# Patient Record
Sex: Female | Born: 1960 | Race: White | Hispanic: No | Smoking: Former smoker
Health system: Southern US, Community
[De-identification: ages and names within clinical notes are randomized; demographics above are authoritative.]

## PROBLEM LIST (undated history)

## (undated) DIAGNOSIS — G8929 Other chronic pain: Secondary | ICD-10-CM

## (undated) DIAGNOSIS — M419 Scoliosis, unspecified: Secondary | ICD-10-CM

## (undated) DIAGNOSIS — K5909 Other constipation: Secondary | ICD-10-CM

## (undated) DIAGNOSIS — R413 Other amnesia: Secondary | ICD-10-CM

## (undated) DIAGNOSIS — M961 Postlaminectomy syndrome, not elsewhere classified: Secondary | ICD-10-CM

## (undated) DIAGNOSIS — M810 Age-related osteoporosis without current pathological fracture: Secondary | ICD-10-CM

## (undated) DIAGNOSIS — R5382 Chronic fatigue, unspecified: Secondary | ICD-10-CM

## (undated) DIAGNOSIS — I509 Heart failure, unspecified: Secondary | ICD-10-CM

## (undated) DIAGNOSIS — M549 Dorsalgia, unspecified: Secondary | ICD-10-CM

## (undated) DIAGNOSIS — I10 Essential (primary) hypertension: Secondary | ICD-10-CM

## (undated) HISTORY — DX: Essential (primary) hypertension: I10

## (undated) HISTORY — DX: Other amnesia: R41.3

## (undated) HISTORY — PX: TUBAL LIGATION: SHX77

## (undated) HISTORY — PX: BACK SURGERY: SHX140

## (undated) HISTORY — DX: Age-related osteoporosis without current pathological fracture: M81.0

## (undated) HISTORY — DX: Chronic fatigue, unspecified: R53.82

## (undated) HISTORY — DX: Heart failure, unspecified: I50.9

---
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
## 2009-11-21 ENCOUNTER — Ambulatory Visit: Payer: Self-pay | Admitting: Diagnostic Radiology

## 2009-11-21 ENCOUNTER — Emergency Department (HOSPITAL_BASED_OUTPATIENT_CLINIC_OR_DEPARTMENT_OTHER)
Admission: EM | Admit: 2009-11-21 | Discharge: 2009-11-21 | Payer: Self-pay | Source: Home / Self Care | Admitting: Emergency Medicine

## 2009-12-04 ENCOUNTER — Emergency Department (HOSPITAL_BASED_OUTPATIENT_CLINIC_OR_DEPARTMENT_OTHER): Admission: EM | Admit: 2009-12-04 | Discharge: 2009-12-04 | Payer: Self-pay | Admitting: Emergency Medicine

## 2014-08-06 ENCOUNTER — Ambulatory Visit: Payer: Self-pay

## 2014-09-28 ENCOUNTER — Ambulatory Visit: Payer: Self-pay

## 2015-02-26 ENCOUNTER — Encounter: Payer: Self-pay | Admitting: Emergency Medicine

## 2015-02-26 ENCOUNTER — Emergency Department
Admission: EM | Admit: 2015-02-26 | Discharge: 2015-02-26 | Disposition: A | Discharge status: Home / Self Care | Payer: Medicaid Other | Attending: Emergency Medicine | Admitting: Emergency Medicine

## 2015-02-26 DIAGNOSIS — Z720 Tobacco use: Secondary | ICD-10-CM | POA: Insufficient documentation

## 2015-02-26 DIAGNOSIS — K625 Hemorrhage of anus and rectum: Secondary | ICD-10-CM

## 2015-02-26 DIAGNOSIS — K649 Unspecified hemorrhoids: Secondary | ICD-10-CM | POA: Diagnosis not present

## 2015-02-26 DIAGNOSIS — Z79899 Other long term (current) drug therapy: Secondary | ICD-10-CM | POA: Insufficient documentation

## 2015-02-26 DIAGNOSIS — K5900 Constipation, unspecified: Secondary | ICD-10-CM | POA: Insufficient documentation

## 2015-02-26 LAB — CBC
HCT: 42.1 % (ref 35.0–47.0)
HEMOGLOBIN: 14.1 g/dL (ref 12.0–16.0)
MCH: 29.3 pg (ref 26.0–34.0)
MCHC: 33.5 g/dL (ref 32.0–36.0)
MCV: 87.5 fL (ref 80.0–100.0)
Platelets: 189 10*3/uL (ref 150–440)
RBC: 4.81 MIL/uL (ref 3.80–5.20)
RDW: 14.2 % (ref 11.5–14.5)
WBC: 5.4 10*3/uL (ref 3.6–11.0)

## 2015-02-26 LAB — COMPREHENSIVE METABOLIC PANEL
ALBUMIN: 4.2 g/dL (ref 3.5–5.0)
ALT: 14 U/L (ref 14–54)
ANION GAP: 8 (ref 5–15)
AST: 24 U/L (ref 15–41)
Alkaline Phosphatase: 64 U/L (ref 38–126)
BUN: 7 mg/dL (ref 6–20)
CALCIUM: 9.3 mg/dL (ref 8.9–10.3)
CO2: 27 mmol/L (ref 22–32)
CREATININE: 0.83 mg/dL (ref 0.44–1.00)
Chloride: 103 mmol/L (ref 101–111)
GFR calc Af Amer: >60 mL/min (ref >60)
Glucose, Bld: 107 mg/dL — ABNORMAL HIGH (ref 65–99)
POTASSIUM: 3.9 mmol/L (ref 3.5–5.1)
Sodium: 138 mmol/L (ref 135–145)
Total Bilirubin: 0.4 mg/dL (ref 0.3–1.2)
Total Protein: 6.7 g/dL (ref 6.5–8.1)

## 2015-02-26 MED ORDER — DOCUSATE SODIUM 100 MG PO CAPS: 100.0000 mg | ORAL_CAPSULE | Freq: Two times a day (BID) | ORAL | Status: AC

## 2015-02-26 NOTE — ED Provider Notes (Signed)
[facility] Emergency Department Provider Note  ____________________________________________  Time seen: 8 AM  I have reviewed the triage vital signs and the nursing notes.   HISTORY  Chief Complaint Rectal Bleeding    HPI Whitney Rivera is a 54 y.o. female who presents with complaints of rectal bleeding. She notes where she has a bowel movement she is seeing blood on the toilet paper and not the toilet bowl. Her stools are brown. She is not on blood thinners. She does report a history of significant constipation for the last 5 years reports she has tried dietary changes and medications with little relief. She has no abdominal pain. No nausea no vomiting. No history of the same     No past medical history on file.  There are no active problems to display for this patient.   No past surgical history on file.  Current Outpatient Rx  Name  Route  Sig  Dispense  Refill  . MAGNESIUM OXIDE PO   Oral   Take 1 tablet by mouth daily.         . Multiple Vitamins-Minerals (MULTIVITAMIN WITH MINERALS) tablet   Oral   Take 1 tablet by mouth daily.         Marland Kitchen oxyCODONE (OXY IR/ROXICODONE) 5 MG immediate release tablet   Oral   Take 5-10 mg by mouth every 4 (four) hours as needed for moderate pain or severe pain. Every 4 to 6 hours as needed           Allergies Ceclor  No family history on file.  Social History History  Substance Use Topics  . Smoking status: Current Every Day Smoker  . Smokeless tobacco: Not on file  . Alcohol Use: No    Review of Systems  Constitutional: Negative for fever. Eyes: Negative for visual changes. ENT: Negative for sore throat Cardiovascular: Negative for chest pain. Gastrointestinal: Positive for constipation Genitourinary: Negative for dysuria. Musculoskeletal: Negative for back pain. Skin: Negative for rash. Neurological: Negative for  headaches     ____________________________________________   PHYSICAL EXAM:  VITAL SIGNS: ED Triage Vitals  Enc Vitals Group     BP 02/26/15 0801 147/76 mmHg     Pulse Rate 02/26/15 0801 96     Resp 02/26/15 0801 18     Temp 02/26/15 0801 97.9 F (36.6 C)     Temp Source 02/26/15 0801 Oral     SpO2 02/26/15 0801 96 %     Weight 02/26/15 0801 138 lb (62.596 kg)     Height 02/26/15 0801  (1.626 m)     Head Cir --      Peak Flow --      Pain Score 02/26/15 0800 4     Pain Loc --      Pain Edu? --      Excl. in GC? --      Constitutional: Alert and oriented. Well appearing and in no distress. Eyes: Conjunctivae are normal.  ENT   Head: Normocephalic and atraumatic.   Mouth/Throat: Mucous membranes are moist. Cardiovascular: Normal rate, regular rhythm. Normal and symmetric distal pulses are present in all extremities. No murmurs, rubs, or gallops. Respiratory: Normal respiratory effort without tachypnea nor retractions. Breath sounds are clear and equal bilaterally.  Gastrointestinal: Soft and non-tender in all quadrants. No distention. There is no CVA tenderness. Genitourinary: deferred Musculoskeletal: Nontender with normal range of motion in all extremities. No lower extremity tenderness nor edema. Neurologic:  Normal speech and language.  No gross focal neurologic deficits are appreciated. Skin:  Skin is warm, dry and intact. No rash noted. Psychiatric: Mood and affect are normal. Patient exhibits appropriate insight and judgment.  ____________________________________________    LABS (pertinent positives/negatives)  Labs Reviewed  COMPREHENSIVE METABOLIC PANEL - Abnormal; Notable for the following:    Glucose, Bld 107 (*)    All other components within normal limits  CBC    ____________________________________________   EKG  None  ____________________________________________    RADIOLOGY I have personally reviewed any xrays that were  ordered on this patient: None  ____________________________________________   PROCEDURES  Procedure(s) performed: none  Critical Care performed: none  ____________________________________________   INITIAL IMPRESSION / ASSESSMENT AND PLAN / ED COURSE  Pertinent labs & imaging results that were available during my care of the patient were reviewed by me and considered in my medical decision making (see chart for details).  History of present illness an exam consistent with hemorrhoidal bleeding. We will check labs to be sure of no abnormalities.  Discussed dietary changes and laxities with patient.  ____________________________________________   FINAL CLINICAL IMPRESSION(S) / ED DIAGNOSES  Final diagnoses:  Rectal bleeding  Hemorrhoids, unspecified hemorrhoid type  Constipation, unspecified constipation type     Jene Everyobert Kenda Kloehn, MD 02/26/15 [phone]

## 2015-02-26 NOTE — Discharge Instructions (Signed)

## 2015-02-26 NOTE — ED Notes (Signed)
Blood when she has bm x 2 days

## 2015-05-27 ENCOUNTER — Emergency Department
Admission: EM | Admit: 2015-05-27 | Discharge: 2015-05-27 | Disposition: A | Discharge status: Home / Self Care | Payer: Medicaid Other | Attending: Emergency Medicine | Admitting: Emergency Medicine

## 2015-05-27 ENCOUNTER — Emergency Department: Payer: Medicaid Other

## 2015-05-27 ENCOUNTER — Encounter: Payer: Self-pay | Admitting: Emergency Medicine

## 2015-05-27 DIAGNOSIS — G8929 Other chronic pain: Secondary | ICD-10-CM | POA: Insufficient documentation

## 2015-05-27 DIAGNOSIS — K5901 Slow transit constipation: Secondary | ICD-10-CM | POA: Diagnosis not present

## 2015-05-27 DIAGNOSIS — R1030 Lower abdominal pain, unspecified: Secondary | ICD-10-CM | POA: Diagnosis present

## 2015-05-27 DIAGNOSIS — M549 Dorsalgia, unspecified: Secondary | ICD-10-CM | POA: Insufficient documentation

## 2015-05-27 DIAGNOSIS — Z720 Tobacco use: Secondary | ICD-10-CM | POA: Diagnosis not present

## 2015-05-27 HISTORY — DX: Scoliosis, unspecified: M41.9

## 2015-05-27 HISTORY — DX: Other constipation: K59.09

## 2015-05-27 HISTORY — DX: Dorsalgia, unspecified: M54.9

## 2015-05-27 HISTORY — DX: Other chronic pain: G89.29

## 2015-05-27 LAB — CBC
HEMATOCRIT: 42.2 % (ref 35.0–47.0)
Hemoglobin: 14 g/dL (ref 12.0–16.0)
MCH: 28.7 pg (ref 26.0–34.0)
MCHC: 33.2 g/dL (ref 32.0–36.0)
MCV: 86.4 fL (ref 80.0–100.0)
PLATELETS: 211 10*3/uL (ref 150–440)
RBC: 4.88 MIL/uL (ref 3.80–5.20)
RDW: 14.1 % (ref 11.5–14.5)
WBC: 8.6 10*3/uL (ref 3.6–11.0)

## 2015-05-27 LAB — URINALYSIS COMPLETE WITH MICROSCOPIC (ARMC ONLY)
BILIRUBIN URINE: NEGATIVE
Bacteria, UA: NONE SEEN
Glucose, UA: NEGATIVE mg/dL
KETONES UR: NEGATIVE mg/dL
LEUKOCYTES UA: NEGATIVE
NITRITE: NEGATIVE
PH: 6 (ref 5.0–8.0)
PROTEIN: NEGATIVE mg/dL
Specific Gravity, Urine: 1.003 — ABNORMAL LOW (ref 1.005–1.030)

## 2015-05-27 LAB — COMPREHENSIVE METABOLIC PANEL WITH GFR
ALT: 11 U/L — ABNORMAL LOW (ref 14–54)
AST: 20 U/L (ref 15–41)
Albumin: 4.4 g/dL (ref 3.5–5.0)
Alkaline Phosphatase: 68 U/L (ref 38–126)
Anion gap: 5 (ref 5–15)
BUN: 9 mg/dL (ref 6–20)
CO2: 25 mmol/L (ref 22–32)
Calcium: 9.1 mg/dL (ref 8.9–10.3)
Chloride: 106 mmol/L (ref 101–111)
Creatinine, Ser: 0.77 mg/dL (ref 0.44–1.00)
GFR calc Af Amer: >60 mL/min
GFR calc non Af Amer: >60 mL/min
Glucose, Bld: 117 mg/dL — ABNORMAL HIGH (ref 65–99)
Potassium: 3.8 mmol/L (ref 3.5–5.1)
Sodium: 136 mmol/L (ref 135–145)
Total Bilirubin: 0.5 mg/dL (ref 0.3–1.2)
Total Protein: 7.2 g/dL (ref 6.5–8.1)

## 2015-05-27 LAB — LIPASE, BLOOD: Lipase: 25 U/L (ref 22–51)

## 2015-05-27 MED ORDER — PEG 3350-KCL-NA BICARB-NACL 420 G PO SOLR: 4000.0000 mL | Freq: Once | ORAL | Status: DC

## 2015-05-27 NOTE — ED Provider Notes (Signed)
[facility] Emergency Department Provider Note     Time seen: ----------------------------------------- 1:01 PM on 05/27/2015 -----------------------------------------    I have reviewed the triage vital signs and the nursing notes.   HISTORY  Chief Complaint Abdominal Pain    HPI Whitney Rivera is a 54 y.o. female who presents ER for constipation for 6 days with lower abdominal pain. She denies any vomiting, and reports decrease oral intake over the last several days. She does have a GI doctor appointment on Thursday of this week. She takes pain medicine for chronic back pain, has had constipation for years.   Past Medical History  Diagnosis Date  . Chronic back pain     There are no active problems to display for this patient.   History reviewed. No pertinent past surgical history.  Allergies Ceclor  Social History Social History  Substance Use Topics  . Smoking status: Current Every Day Smoker  . Smokeless tobacco: None  . Alcohol Use: No    Review of Systems Constitutional: Negative for fever. Eyes: Negative for visual changes. ENT: Negative for sore throat. Cardiovascular: Negative for chest pain. Respiratory: Negative for shortness of breath. Gastrointestinal: Positive for abdominal pain and constipation Genitourinary: Negative for dysuria. Musculoskeletal: Positive for chronic back pain Skin: Negative for rash. Neurological: Negative for headaches, focal weakness or numbness.  10-point ROS otherwise negative.  ____________________________________________   PHYSICAL EXAM:  VITAL SIGNS: ED Triage Vitals  Enc Vitals Group     BP 05/27/15 1047 160/74 mmHg     Pulse Rate 05/27/15 1047 109     Resp 05/27/15 1047 20     Temp 05/27/15 1047 97.7 F (36.5 C)     Temp Source 05/27/15 1047 Oral     SpO2 05/27/15 1047 96 %     Weight 05/27/15 1047 131 lb (59.421 kg)     Height 05/27/15 1047  (1.626 m)     Head Cir --     Peak Flow --      Pain Score 05/27/15 1048 8     Pain Loc --      Pain Edu? --      Excl. in GC? --     Constitutional: Alert and oriented. Well appearing and in no distress. Eyes: Conjunctivae are normal. PERRL. Normal extraocular movements. ENT   Head: Normocephalic and atraumatic.   Nose: No congestion/rhinnorhea.   Mouth/Throat: Mucous membranes are moist.   Neck: No stridor. Cardiovascular: Normal rate, regular rhythm. Normal and symmetric distal pulses are present in all extremities. No murmurs, rubs, or gallops. Respiratory: Normal respiratory effort without tachypnea nor retractions. Breath sounds are clear and equal bilaterally. No wheezes/rales/rhonchi. Gastrointestinal: Soft with nonfocal tenderness, no rebound or guarding. Normal bowel sounds..  Rectal: No stool is present, no blood is present, no hemorrhoids. Musculoskeletal: Nontender with normal range of motion in all extremities. No joint effusions.  No lower extremity tenderness nor edema. Neurologic:  Normal speech and language. No gross focal neurologic deficits are appreciated. Speech is normal. No gait instability. Skin:  Skin is warm, dry and intact. No rash noted. Psychiatric: Mood and affect are normal. Speech and behavior are normal. Patient exhibits appropriate insight and judgment. ____________________________________________  ED COURSE:  Pertinent labs & imaging results that were available during my care of the patient were reviewed by me and considered in my medical decision making (see chart for details).  We'll check abdominal labs and likely imaging.  ___________________________________________    LABS (pertinent positives/negatives)  Labs Reviewed  COMPREHENSIVE METABOLIC PANEL - Abnormal; Notable for the following:    Glucose, Bld 117 (*)    ALT 11 (*)    All other components within normal limits  URINALYSIS COMPLETEWITH MICROSCOPIC ([facility] ONLY) - Abnormal; Notable for the  following:    Color, Urine STRAW (*)    APPearance CLEAR (*)    Specific Gravity, Urine 1.003 (*)    Hgb urine dipstick 1+ (*)    Squamous Epithelial / LPF 0-5 (*)    All other components within normal limits  LIPASE, BLOOD  CBC    RADIOLOGY Images were viewed by me  Abdomen 2 view IMPRESSION: Moderate amount of stool throughout the colon, consistent with the history given of constipation. ____________________________________________  FINAL ASSESSMENT AND PLAN  Abdominal pain, constipation  Plan: Patient with labs and imaging as dictated above. Here there was no rectal stool on exam. She'll be discharged with TriLyte to take and she can follow-up with GI as scheduled.   Emily Filbert, MD   Emily Filbert, MD 05/27/15 [phone]

## 2015-05-27 NOTE — ED Notes (Signed)
Pt to ed with c/o constipation x 6 days and lower abd pain,  Denies vomiting.  Pt reports no intake for 2 days. Has appt with GI MD on Thursday.

## 2015-05-27 NOTE — Discharge Instructions (Signed)

## 2016-01-06 ENCOUNTER — Encounter: Payer: Self-pay | Admitting: Physical Therapy

## 2016-01-06 ENCOUNTER — Ambulatory Visit: Payer: Medicaid Other | Attending: Neurosurgery | Admitting: Physical Therapy

## 2016-01-06 DIAGNOSIS — M6281 Muscle weakness (generalized): Secondary | ICD-10-CM | POA: Insufficient documentation

## 2016-01-06 DIAGNOSIS — R293 Abnormal posture: Secondary | ICD-10-CM | POA: Diagnosis present

## 2016-01-06 DIAGNOSIS — R262 Difficulty in walking, not elsewhere classified: Secondary | ICD-10-CM | POA: Diagnosis present

## 2016-01-06 DIAGNOSIS — M5442 Lumbago with sciatica, left side: Secondary | ICD-10-CM | POA: Diagnosis not present

## 2016-01-06 NOTE — Patient Instructions (Addendum)
HEP: hooklying SKTC with knee extensions 2x10; postural correction

## 2016-01-06 NOTE — Therapy (Signed)
[facility] PHYSICAL AND SPORTS MEDICINE [address], [place], [address] Phone: [phone]   Fax:  [phone]  Physical Therapy Evaluation  Patient Details  Name: Whitney Rivera MRN: [id number] Date of Birth: [birth year] Referring Provider: Rockne Menghini  Encounter Date: 01/06/2016      PT End of Session - 01/06/16 1148    Visit Number 1   Number of Visits 17   Date for PT Re-Evaluation 03/02/16   PT Start Time 1025   PT Stop Time 1110   PT Time Calculation (min) 45 min   Activity Tolerance Patient tolerated treatment well;Patient limited by pain   Behavior During Therapy [facility] for tasks assessed/performed      Past Medical History  Diagnosis Date  . Chronic back pain   . Scoliosis   . Chronic constipation     Past Surgical History  Procedure Laterality Date  . Tubal ligation      There were no vitals filed for this visit.       Subjective Assessment - 01/06/16 1036    Subjective Pt presents with LBP L>R with L LE radiating symptoms, occasionally beyond her knee. Pt primarily has numbness in L LE with occasional pain and tingling. She feels as though it is difficult to DF L foot since surgery.   Pertinent History Pt has had chronic back pain and recently a LT L4-5 decompression hemilaminectomy and medial facetectomy/foraminotomy on 11/20/15. She currently has back precautions for no B/L/T per pt report. Pt initially had resolved symptoms after surgery, but has had gradually increasing back and L LE pain since. Pt had pain and N/T in low back and in L LE prior to surgery, but continues at this date.   Limitations Sitting;Lifting;Standing;Walking;House hold activities   How long can you sit comfortably? 30 minutes   How long can you stand comfortably? 10 minutes   How long can you walk comfortably? intermediate distances   Patient Stated Goals "I want to get better"   Currently in Pain? Yes   Pain Score 4    Pain  Location Back  L>R low back pain with numbness into L LE down to foot   Pain Descriptors / Indicators Aching;Heaviness   Pain Type Chronic pain;Surgical pain   Pain Onset More than a month ago   Pain Frequency Constant   Aggravating Factors  standing, sitting   Pain Relieving Factors ice/heat, rest, bath, medicine, lying flat on her back   Effect of Pain on Daily Activities very limited funciton            [facility] PT Assessment - 01/06/16 0001    Assessment   Medical Diagnosis LBP s/p LT L4-5 decompression   Referring Provider Rockne Menghini   Onset Date/Surgical Date 11/20/15   Hand Dominance Right   Next MD Visit --  pt thinks about 4 weeks   Prior Therapy none   Precautions   Precautions Back   Precaution Booklet Issued No   Restrictions   Weight Bearing Restrictions No   Balance Screen   Has the patient fallen in the past 6 months Yes   How many times? 1   Has the patient had a decrease in activity level because of a fear of falling?  Yes   Is the patient reluctant to leave their home because of a fear of falling?  Yes   Home Environment   Living Environment Private residence   Living Arrangements Alone   Available Help at  Discharge Family   Type of Home House   Home Access Stairs to enter   Entrance Stairs-Number of Steps 1   Entrance Stairs-Rails None   Home Layout One level   Home Equipment [place] - single point;Tub bench   Prior Function   Level of Independence Independent   Vocation Unemployed   Leisure reading, gardening       POSTURE/OBSERVATION: Forward flexed with rounded shoulders   PROM/AROM: Thoracic extension and flexion moderately limited Thoracic rotation NT due to back precaution Lumbar flexion and extension present very stiff, not formally tested due to back precautions    STRENGTH:  Graded on a 0-5 scale Muscle Group Left Right  Hip Flex 4-/5 3+/5  Hip Abd 3+/5 3/5  Hip Ext 3/5 3-/5  Knee Flex 4-/5 3+/5  Knee Ext 4-/5 3+/5   Ankle DF 4-/5 3/5  Testing limited by pain  SENSATION: B UE intact to light touch L LE decreased sensation to light touch with progressive decrease distally Lumbar paraspinals TTP B piriformis L>R TTP  SPECIAL TESTS: ODI - 76% SLR - (+) L LE, (-) R LE Bridge - poor trunk and hip control    BALANCE: Good, however pt reports falling out of tub yesterday  GAIT: Significantly limited trunk rotation and arm swing with stiff gait  OUTCOME MEASURES: TEST Outcome Interpretation  5 times sit<>stand 25 sec >60 yo, >15 sec indicates increased risk for falls   Objectives: Therex: Hooklying TA 10x5 seconds with mod cues for proper technique with difficulty performing correct contraction Hooklying with L KTC with knee extensions x15 Hooklying with L KTC with knee in increased extension with ankle pumps For neural flossing to decrease tension and pain. Pt reported some decreased leg pain afterwards. Cues for gentle ROM and to not stretch into pain.                      PT Education - 01/06/16 1147    Education provided Yes   Education Details discussed POC; HEP; postural correction   Person(s) Educated Patient   Methods Explanation;Demonstration   Comprehension Verbalized understanding;Returned demonstration          PT Short Term Goals - 01/06/16 1202    PT SHORT TERM GOAL #1   Title Pt will score 70% or < on modified ODI for reduced functional disability within 4 weeks.   Baseline 76%   Time 4   Period Weeks   Status New           PT Long Term Goals - 01/06/16 1155    PT LONG TERM GOAL #1   Title Pt will report maximal back pain of < 4/10 for increased activity tolerance within 8 weeks.   Baseline 7/10   Time 8   Period Weeks   Status New   PT LONG TERM GOAL #2   Title Pt will score <20% on ODI to increase ability to perform ADLs within 8 weeks.   Baseline 76%   Time 8   Period Weeks   Status New   PT LONG TERM GOAL #3   Title Pt will  score <15 seconds on 5 x STS for reduced risk of falls within 8 weeks.   Baseline 25 seconds   Time 8   Period Weeks   Status New               Plan - 01/06/16 1150    Clinical Impression Statement Pt demonstrated deficits of thoracic  and lumbar ROM, core and LE strength and trunk stability with chronic back pain s/p LT L4-5 decompression hemilaminectomy and medial facetectomy/foraminotomy on 11/20/15. She will benefit from skilled PT to addres deficits and progress towards PLOF with reduced pain.   Rehab Potential Good   Clinical Impairments Affecting Rehab Potential chronic pain   PT Frequency 2x / week   PT Duration 8 weeks   PT Treatment/Interventions Aquatic Therapy;Biofeedback;Radiation protection practitionerlectrical Stimulation;Moist Heat;Ultrasound;Gait training;Stair training;Therapeutic activities;Therapeutic exercise;Balance training;Neuromuscular re-education;Patient/family education;Manual techniques   PT Next Visit Plan progress core and hip strengthening   PT Home Exercise Plan hooklying SKTC with knee extensions    Consulted and Agree with Plan of Care Patient      Patient will benefit from skilled therapeutic intervention in order to improve the following deficits and impairments:  Decreased activity tolerance, Decreased balance, Decreased range of motion, Hypomobility, Difficulty walking, Decreased strength, Increased muscle spasms, Impaired flexibility, Impaired sensation, Improper body mechanics, Pain  Visit Diagnosis: Left-sided low back pain with left-sided sciatica - Plan: PT plan of care cert/re-cert  Muscle weakness (generalized) - Plan: PT plan of care cert/re-cert  Difficulty in walking, not elsewhere classified - Plan: PT plan of care cert/re-cert  Abnormal posture - Plan: PT plan of care cert/re-cert     Problem List There are no active problems to display for this patient.   Alany Borman Lucillie GarfinkelJo Cyntia Staley, PT, DPT  01/06/2016, 12:12 PM [phone]  [facility]IC[facility] [address] [place], [place], [address] Phone: [phone]   Fax:  [phone]  Name: Whitney MorasSonya Rivera MRN: [id number] Date of Birth: 1961/02/11

## 2016-01-13 ENCOUNTER — Ambulatory Visit: Payer: Medicaid Other | Admitting: Physical Therapy

## 2016-01-24 ENCOUNTER — Emergency Department
Admission: EM | Admit: 2016-01-24 | Discharge: 2016-01-24 | Disposition: A | Discharge status: Home / Self Care | Payer: Medicaid Other | Attending: Emergency Medicine | Admitting: Emergency Medicine

## 2016-01-24 DIAGNOSIS — F17200 Nicotine dependence, unspecified, uncomplicated: Secondary | ICD-10-CM | POA: Diagnosis not present

## 2016-01-24 DIAGNOSIS — J029 Acute pharyngitis, unspecified: Secondary | ICD-10-CM | POA: Insufficient documentation

## 2016-01-24 DIAGNOSIS — H9202 Otalgia, left ear: Secondary | ICD-10-CM

## 2016-01-24 MED ORDER — DOXYCYCLINE HYCLATE 100 MG PO CAPS: 100.0000 mg | ORAL_CAPSULE | Freq: Two times a day (BID) | ORAL | Status: DC

## 2016-01-24 NOTE — ED Notes (Signed)
Pt states she started with a sore throat yesterday and now is having worse throat and left ear pain, pt states that it hurts to swallow

## 2016-01-24 NOTE — Discharge Instructions (Signed)
Pharyngitis °Pharyngitis is redness, pain, and swelling (inflammation) of your pharynx.  °CAUSES  °Pharyngitis is usually caused by infection. Most of the time, these infections are from viruses (viral) and are part of a cold. However, sometimes pharyngitis is caused by bacteria (bacterial). Pharyngitis can also be caused by allergies. Viral pharyngitis may be spread from person to person by coughing, sneezing, and personal items or utensils (cups, forks, spoons, toothbrushes). Bacterial pharyngitis may be spread from person to person by more intimate contact, such as kissing.  °SIGNS AND SYMPTOMS  °Symptoms of pharyngitis include:   °· Sore throat.   °· Tiredness (fatigue).   °· Low-grade fever.   °· Headache. °· Joint pain and muscle aches. °· Skin rashes. °· Swollen lymph nodes. °· Plaque-like film on throat or tonsils (often seen with bacterial pharyngitis). °DIAGNOSIS  °Your health care provider will ask you questions about your illness and your symptoms. Your medical history, along with a physical exam, is often all that is needed to diagnose pharyngitis. Sometimes, a rapid strep test is done. Other lab tests may also be done, depending on the suspected cause.  °TREATMENT  °Viral pharyngitis will usually get better in 3-4 days without the use of medicine. Bacterial pharyngitis is treated with medicines that kill germs (antibiotics).  °HOME CARE INSTRUCTIONS  °· Drink enough water and fluids to keep your urine clear or pale yellow.   °· Only take over-the-counter or prescription medicines as directed by your health care provider:   °· If you are prescribed antibiotics, make sure you finish them even if you start to feel better.   °· Do not take aspirin.   °· Get lots of rest.   °· Gargle with 8 oz of salt water (½ tsp of salt per 1 qt of water) as often as every 1-2 hours to soothe your throat.   °· Throat lozenges (if you are not at risk for choking) or sprays may be used to soothe your throat. °SEEK MEDICAL  CARE IF:  °· You have large, tender lumps in your neck. °· You have a rash. °· You cough up green, yellow-brown, or bloody spit. °SEEK IMMEDIATE MEDICAL CARE IF:  °· Your neck becomes stiff. °· You drool or are unable to swallow liquids. °· You vomit or are unable to keep medicines or liquids down. °· You have severe pain that does not go away with the use of recommended medicines. °· You have trouble breathing (not caused by a stuffy nose). °MAKE SURE YOU:  °· Understand these instructions. °· Will watch your condition. °· Will get help right away if you are not doing well or get worse. °  °This information is not intended to replace advice given to you by your health care provider. Make sure you discuss any questions you have with your health care provider. °  °Document Released: 08/10/2005 Document Revised: 05/31/2013 Document Reviewed: 04/17/2013 °Elsevier Interactive Patient Education ©2016 [employer] ° °Earache °An earache, also called otalgia, can be caused by many things. Pain from an earache can be sharp, dull, or burning. The pain may be temporary or constant. °Earaches can be caused by problems with the ear, such as infection in either the middle ear or the ear canal, injury, impacted ear wax, middle ear pressure, or a foreign body in the ear. Ear pain can also result from problems in other areas. This is called referred pain. For example, pain can come from a sore throat, a tooth infection, or problems with the jaw or the joint between the jaw and the   skull (temporomandibular joint, or TMJ). °The cause of an earache is not always easy to identify. Watchful waiting may be appropriate for some earaches until a clear cause of the pain can be found. °HOME CARE INSTRUCTIONS °Watch your condition for any changes. The following actions may help to lessen any discomfort that you are feeling: °· Take medicines only as directed by your health care provider. This includes ear drops. °· Apply ice to your outer  ear to help reduce pain. °¨ Put ice in a plastic bag. °¨ Place a towel between your skin and the bag. °¨ Leave the ice on for 20 minutes, 2-3 times per day. °· Do not put anything in your ear other than medicine that is prescribed by your health care provider. °· Try resting in an upright position instead of lying down. This may help to reduce pressure in the middle ear and relieve pain. °· Chew gum if it helps to relieve your ear pain. °· Control any allergies that you have. °· Keep all follow-up visits as directed by your health care provider. This is important. °SEEK MEDICAL CARE IF: °· Your pain does not improve within 2 days. °· You have a fever. °· You have new or worsening symptoms. °SEEK IMMEDIATE MEDICAL CARE IF: °· You have a severe headache. °· You have a stiff neck. °· You have difficulty swallowing. °· You have redness or swelling behind your ear. °· You have drainage from your ear. °· You have hearing loss. °· You feel dizzy. °  °This information is not intended to replace advice given to you by your health care provider. Make sure you discuss any questions you have with your health care provider. °  °Document Released: 03/27/2004 Document Revised: 08/31/2014 Document Reviewed: 03/11/2014 °Elsevier Interactive Patient Education ©2016 [employer] ° °

## 2016-01-24 NOTE — ED Provider Notes (Signed)
[facility] Emergency Department Provider Note  ____________________________________________  Time seen: Approximately 3:10 PM  I have reviewed the triage vital signs and the nursing notes.   HISTORY  Chief Complaint Sore Throat   HPI Whitney Rivera is a 55 y.o. female who presents to the emergency department for evaluation of sore throat that started yesterday.   Past Medical History  Diagnosis Date  . Chronic back pain   . Scoliosis   . Chronic constipation     There are no active problems to display for this patient.   Past Surgical History  Procedure Laterality Date  . Tubal ligation      Current Outpatient Rx  Name  Route  Sig  Dispense  Refill  . docusate sodium (COLACE) 100 MG capsule   Oral   Take 1 capsule (100 mg total) by mouth 2 (two) times daily.   20 capsule   2   . doxycycline (VIBRAMYCIN) 100 MG capsule   Oral   Take 1 capsule (100 mg total) by mouth 2 (two) times daily.   20 capsule   0   . MAGNESIUM OXIDE PO   Oral   Take 1 tablet by mouth daily.         . Multiple Vitamins-Minerals (MULTIVITAMIN WITH MINERALS) tablet   Oral   Take 1 tablet by mouth daily.         Marland Kitchen. oxyCODONE (OXY IR/ROXICODONE) 5 MG immediate release tablet   Oral   Take 5-10 mg by mouth every 4 (four) hours as needed for moderate pain or severe pain. Every 4 to 6 hours as needed         . polyethylene glycol-electrolytes (TRILYTE) 420 G solution   Oral   Take 4,000 mLs by mouth once.   4000 mL   0     Allergies Ceclor  No family history on file.  Social History Social History  Substance Use Topics  . Smoking status: Current Every Day Smoker  . Smokeless tobacco: Not on file  . Alcohol Use: No    Review of Systems Constitutional: Negative for fever. Eyes: No visual changes. ENT: Positive for sore throat; negative for difficulty swallowing. Respiratory: Denies shortness of breath. Gastrointestinal: No abdominal pain.  No  nausea, no vomiting.  No diarrhea. . Musculoskeletal: Negative for generalized body aches. Skin: Negative for rash. Neurological: Negative for headaches, focal weakness or numbness.  ____________________________________________   PHYSICAL EXAM:  VITAL SIGNS: ED Triage Vitals  Enc Vitals Group     BP 01/24/16 1437 141/92 mmHg     Pulse Rate 01/24/16 1437 103     Resp 01/24/16 1437 18     Temp 01/24/16 1437 98.5 F (36.9 C)     Temp Source 01/24/16 1437 Oral     SpO2 01/24/16 1437 96 %     Weight 01/24/16 1437 120 lb (54.432 kg)     Height 01/24/16 1437 5\' 4"  (1.626 m)     Head Cir --      Peak Flow --      Pain Score 01/24/16 1438 6     Pain Loc --      Pain Edu? --      Excl. in GC? --    Constitutional: Alert and oriented. Well appearing and in no acute distress. Eyes: Conjunctivae are normal. PERRL. EOMI. Head: Atraumatic. Ears: Bilateral TM unremarkable. Nose: No congestion/rhinnorhea. Mouth/Throat: Mucous membranes are moist.  Oropharynx erythematous, without exudate. Neck: No stridor.  Lymphatic:  Lymphadenopathy: Anterior cervical nodes tender to palpation--worse on the left Cardiovascular: Normal rate, regular rhythm. Good peripheral circulation. Respiratory: Normal respiratory effort. Lungs CTAB. Gastrointestinal: Soft and nontender. Musculoskeletal: No lower extremity tenderness nor edema.   Neurologic:  Normal speech and language. No gross focal neurologic deficits are appreciated. Speech is normal. No gait instability. Skin:  Skin is warm, dry and intact. No rash noted. Multiple sites on lower extremities and back from tick bites. No cellulitis noted Psychiatric: Mood and affect are normal. Speech and behavior are normal.  ____________________________________________   LABS (all labs ordered are listed, but only abnormal results are displayed)  Labs Reviewed - No data to  display ____________________________________________  EKG   ____________________________________________  RADIOLOGY   ____________________________________________   PROCEDURES  Procedure(s) performed: None  Critical Care performed: No  ____________________________________________   INITIAL IMPRESSION / ASSESSMENT AND PLAN / ED COURSE  Pertinent labs & imaging results that were available during my care of the patient were reviewed by me and considered in my medical decision making (see chart for details).  Will cover with doxycycline due to multiple tick bites and generalized body aches with pharyngitis and otalgia. She is to follow up with her PCP for symptoms that are not improving over the weekend. She is to return to the ER for symptoms that change or worsen if unable to schedule an appointment.  ____________________________________________   FINAL CLINICAL IMPRESSION(S) / ED DIAGNOSES  Final diagnoses:  Acute pharyngitis, unspecified etiology  Otalgia of left ear      Chinita Pester, FNP 01/24/16 1656  Loleta Rose, MD 01/24/16 2222

## 2016-01-28 ENCOUNTER — Ambulatory Visit: Payer: Medicaid Other | Admitting: Physical Therapy

## 2016-02-06 ENCOUNTER — Ambulatory Visit: Payer: Medicaid Other | Attending: Neurosurgery | Admitting: Physical Therapy

## 2016-02-06 DIAGNOSIS — M6281 Muscle weakness (generalized): Secondary | ICD-10-CM

## 2016-02-06 DIAGNOSIS — M5442 Lumbago with sciatica, left side: Secondary | ICD-10-CM | POA: Insufficient documentation

## 2016-02-06 NOTE — Therapy (Signed)
[facility] PHYSICAL AND SPORTS MEDICINE [address] [place], [place], [address] Phone: [phone]   Fax:  [phone]  Physical Therapy Treatment  Patient Details  Name: Domenic MorasSonya Venn MRN: [id number] Date of Birth: Nov 15, 1960 Referring Provider: Rockne MenghiniMichaux Kilpatrick  Encounter Date: 02/06/2016      PT End of Session - 02/06/16 1428    Visit Number 2   Number of Visits 17   Date for PT Re-Evaluation 03/02/16   PT Start Time 1345   PT Stop Time 1425   PT Time Calculation (min) 40 min   Activity Tolerance Patient tolerated treatment well;Patient limited by pain   Behavior During Therapy [facility] for tasks assessed/performed      Past Medical History  Diagnosis Date  . Chronic back pain   . Scoliosis   . Chronic constipation     Past Surgical History  Procedure Laterality Date  . Tubal ligation      There were no vitals filed for this visit.      Subjective Assessment - 02/06/16 1350    Subjective Pt reports her pain is increasing. She is having L hip pain, LLE pain and weakness throughout her day.   Pertinent History Pt has had chronic back pain and recently a LT L4-5 decompression hemilaminectomy and medial facetectomy/foraminotomy on 11/20/15. She currently has back precautions for no B/L/T per pt report. Pt initially had resolved symptoms after surgery, but has had gradually increasing back and L LE pain since. Pt had pain and N/T in low back and in L LE prior to surgery, but continues at this date.   Limitations Sitting;Lifting;Standing;Walking;House hold activities   How long can you sit comfortably? 30 minutes   How long can you stand comfortably? 10 minutes   How long can you walk comfortably? intermediate distances   Patient Stated Goals "I want to get better"   Currently in Pain? Yes   Pain Score 5    Pain Location Back   Pain Onset More than a month ago              Objective: Performed standing awareness exercises  for addressing postural dysfunction, pt is able to feel L lateral lean but unable to correct initially even with verbal cuing.  Performed lateral wt shifts x1 min, to find neutral posture, reported incr. Pain with neutral posture.  Performed breathing in this posture, pt extends thoracic spine to breathe.  Modified yoga routine as pt prefers yoga exercises:  Supine hooklying deep breathing with cuing for avoiding accessory muscle breathing.  Modified downward dog on elevated plinth, multiple reps, cuing for  Breathing, neutral spine, elongated neck, relaxed neck.  Modified triangle, twisted triangle on elevated plinth, similar cuing. Pain with L rotation, educated pt on avoiding rotation beyond pain free range.  Seated warrior progression, education on correct performance of exercise.  Answered pt questions regarding progression of exercise.                   PT Education - 02/06/16 1428    Education provided Yes   Education Details yoga/relaxation/breathing training   Person(s) Educated Patient   Methods Explanation   Comprehension Verbalized understanding          PT Short Term Goals - 01/06/16 1202    PT SHORT TERM GOAL #1   Title Pt will score 70% or < on modified ODI for reduced functional disability within 4 weeks.   Baseline 76%   Time 4  Period Weeks   Status New           PT Long Term Goals - 01/06/16 1155    PT LONG TERM GOAL #1   Title Pt will report maximal back pain of < 4/10 for increased activity tolerance within 8 weeks.   Baseline 7/10   Time 8   Period Weeks   Status New   PT LONG TERM GOAL #2   Title Pt will score <20% on ODI to increase ability to perform ADLs within 8 weeks.   Baseline 76%   Time 8   Period Weeks   Status New   PT LONG TERM GOAL #3   Title Pt will score <15 seconds on 5 x STS for reduced risk of falls within 8 weeks.   Baseline 25 seconds   Time 8   Period Weeks   Status New               Plan  - 02/06/16 1429    Clinical Impression Statement Pt has decr. pain with cuing for deep breathing, relaxation and progressive relaxation with breathing exercises. Continues to c/o chronic back pain, hip pain, leg pain, and weakness in back and leg.   Rehab Potential Good   Clinical Impairments Affecting Rehab Potential chronic pain   PT Frequency 2x / week   PT Duration 8 weeks   PT Treatment/Interventions Aquatic Therapy;Biofeedback;Radiation protection practitioner;Therapeutic activities;Therapeutic exercise;Balance training;Neuromuscular re-education;Patient/family education;Manual techniques   PT Next Visit Plan progress core and hip strengthening   PT Home Exercise Plan hooklying SKTC with knee extensions    Consulted and Agree with Plan of Care Patient      Patient will benefit from skilled therapeutic intervention in order to improve the following deficits and impairments:  Decreased activity tolerance, Decreased balance, Decreased range of motion, Hypomobility, Difficulty walking, Decreased strength, Increased muscle spasms, Impaired flexibility, Impaired sensation, Improper body mechanics, Pain  Visit Diagnosis: Left-sided low back pain with left-sided sciatica  Muscle weakness (generalized)     Problem List There are no active problems to display for this patient.   Fisher,Benjamin PT DPT 02/06/2016, 2:31 PM  [facility] PHYSICAL AND SPORTS MEDICINE [address] [place], [address] Phone: [phone]   Fax:  [phone]  Name: Hitomi Slape MRN: [id number] Date of Birth: August 16, 1961

## 2016-02-12 ENCOUNTER — Encounter: Payer: Medicaid Other | Admitting: Physical Therapy

## 2016-02-20 ENCOUNTER — Ambulatory Visit: Payer: Medicaid Other | Admitting: Physical Therapy

## 2016-02-20 DIAGNOSIS — M5442 Lumbago with sciatica, left side: Secondary | ICD-10-CM

## 2016-02-20 DIAGNOSIS — M6281 Muscle weakness (generalized): Secondary | ICD-10-CM

## 2016-02-20 NOTE — Therapy (Signed)
[facility] PHYSICAL AND SPORTS MEDICINE [address] [place], [place], [address] Phone: [phone]   Fax:  [phone]  Physical Therapy Treatment  Patient Details  Name: Whitney Rivera MRN: [id number] Date of Birth: 1961-01-14 Referring Provider: Rockne MenghiniMichaux Kilpatrick  Encounter Date: 02/20/2016      PT End of Session - 02/20/16 1500    Visit Number 3   Number of Visits 17   Date for PT Re-Evaluation 03/02/16   PT Start Time 1345   PT Stop Time 1425   PT Time Calculation (min) 40 min   Activity Tolerance Patient tolerated treatment well;Patient limited by pain   Behavior During Therapy [facility] for tasks assessed/performed      Past Medical History  Diagnosis Date  . Chronic back pain   . Scoliosis   . Chronic constipation     Past Surgical History  Procedure Laterality Date  . Tubal ligation      There were no vitals filed for this visit.      Subjective Assessment - 02/20/16 1348    Subjective Pt rpeorts her pain is significant and is worsening. She has an appointment with her MD on 7/18 and will defer final session of PT until after this. Pt has taken multiple pain medications today.   Pertinent History Pt has had chronic back pain and recently a LT L4-5 decompression hemilaminectomy and medial facetectomy/foraminotomy on 11/20/15. She currently has back precautions for no B/L/T per pt report. Pt initially had resolved symptoms after surgery, but has had gradually increasing back and L LE pain since. Pt had pain and N/T in low back and in L LE prior to surgery, but continues at this date.   Limitations Sitting;Lifting;Standing;Walking;House hold activities   How long can you sit comfortably? 30 minutes   How long can you stand comfortably? 10 minutes   How long can you walk comfortably? intermediate distances   Patient Stated Goals "I want to get better"   Currently in Pain? Yes   Pain Score 3    Pain Location Back   Pain Onset More  than a month ago              Objective: OMEGA scapular retraction 10# (limit of control) 3x10 with cuing for posture.  OMEGA shoulder press 10#, pt reported this was difficult compared with retraction.  Deep breathing 3x10 breaths, cuing for slow breathing, relaxing shoulders to minmize tension in back.  Nu step L1 x5 min with cuing for minimizing shoulder activaiton/focusing on glute activation.  OMEGA core leans 3x10 with 5#  Pt required cuing for performance of exercises, appropriate TA contraction and deep breathing. Left with incr. Pain but felt encouraged that she can perform these exercises on her own.                   PT Education - 02/20/16 1500    Education provided Yes   Education Details gym machines to address muscle weakness/back pain   Person(s) Educated Patient   Methods Explanation   Comprehension Verbalized understanding          PT Short Term Goals - 01/06/16 1202    PT SHORT TERM GOAL #1   Title Pt will score 70% or < on modified ODI for reduced functional disability within 4 weeks.   Baseline 76%   Time 4   Period Weeks   Status New           PT Long Term Goals -  01/06/16 1155    PT LONG TERM GOAL #1   Title Pt will report maximal back pain of < 4/10 for increased activity tolerance within 8 weeks.   Baseline 7/10   Time 8   Period Weeks   Status New   PT LONG TERM GOAL #2   Title Pt will score <20% on ODI to increase ability to perform ADLs within 8 weeks.   Baseline 76%   Time 8   Period Weeks   Status New   PT LONG TERM GOAL #3   Title Pt will score <15 seconds on 5 x STS for reduced risk of falls within 8 weeks.   Baseline 25 seconds   Time 8   Period Weeks   Status New               Plan - 02/20/16 1501    Clinical Impression Statement Pt is continuing to worsen in her experience of pain. PT encouraged pt to attempt to go to the gym and try pool exercises to address pain, issued gym exercises to  be performed to strengthen core and gently strengthen back between now and next session in one month. Continues to c/o back pain, hip pain, leg pain, and weakness.   Rehab Potential Good   Clinical Impairments Affecting Rehab Potential chronic pain   PT Frequency 2x / week   PT Duration 8 weeks   PT Treatment/Interventions Aquatic Therapy;Biofeedback;Radiation protection practitionerlectrical Stimulation;Moist Heat;Ultrasound;Gait training;Stair training;Therapeutic activities;Therapeutic exercise;Balance training;Neuromuscular re-education;Patient/family education;Manual techniques   PT Next Visit Plan progress core and hip strengthening   PT Home Exercise Plan hooklying SKTC with knee extensions    Consulted and Agree with Plan of Care Patient      Patient will benefit from skilled therapeutic intervention in order to improve the following deficits and impairments:  Decreased activity tolerance, Decreased balance, Decreased range of motion, Hypomobility, Difficulty walking, Decreased strength, Increased muscle spasms, Impaired flexibility, Impaired sensation, Improper body mechanics, Pain  Visit Diagnosis: Muscle weakness (generalized)  Left-sided low back pain with left-sided sciatica     Problem List There are no active problems to display for this patient.   Aleya Durnell PT DPT 02/20/2016, 3:11 PM  [facility] PHYSICAL AND SPORTS MEDICINE [address] [place], [place], [address] Phone: [phone]   Fax:  [phone]  Name: Whitney Rivera MRN: [id number] Date of Birth: [birth year]

## 2016-03-05 ENCOUNTER — Encounter: Payer: Medicaid Other | Admitting: Physical Therapy

## 2016-03-16 ENCOUNTER — Other Ambulatory Visit (HOSPITAL_COMMUNITY): Payer: Self-pay | Admitting: Neurosurgery

## 2016-03-16 ENCOUNTER — Ambulatory Visit: Payer: Medicaid Other | Admitting: Physical Therapy

## 2016-03-16 DIAGNOSIS — M5416 Radiculopathy, lumbar region: Secondary | ICD-10-CM

## 2016-03-16 DIAGNOSIS — M5136 Other intervertebral disc degeneration, lumbar region: Secondary | ICD-10-CM

## 2016-03-24 ENCOUNTER — Ambulatory Visit (HOSPITAL_COMMUNITY)
Admission: RE | Admit: 2016-03-24 | Discharge: 2016-03-24 | Disposition: A | Discharge status: Home / Self Care | Payer: Medicaid Other | Source: Ambulatory Visit | Attending: Neurosurgery | Admitting: Neurosurgery

## 2016-03-24 DIAGNOSIS — M5126 Other intervertebral disc displacement, lumbar region: Secondary | ICD-10-CM | POA: Insufficient documentation

## 2016-03-24 DIAGNOSIS — M2578 Osteophyte, vertebrae: Secondary | ICD-10-CM | POA: Diagnosis not present

## 2016-03-24 DIAGNOSIS — M5416 Radiculopathy, lumbar region: Secondary | ICD-10-CM | POA: Diagnosis not present

## 2016-03-24 DIAGNOSIS — M5136 Other intervertebral disc degeneration, lumbar region: Secondary | ICD-10-CM

## 2016-03-24 DIAGNOSIS — M1288 Other specific arthropathies, not elsewhere classified, other specified site: Secondary | ICD-10-CM | POA: Diagnosis not present

## 2016-03-24 DIAGNOSIS — R600 Localized edema: Secondary | ICD-10-CM | POA: Diagnosis not present

## 2016-03-24 DIAGNOSIS — M4806 Spinal stenosis, lumbar region: Secondary | ICD-10-CM | POA: Diagnosis not present

## 2016-03-24 MED ORDER — GADOBENATE DIMEGLUMINE 529 MG/ML IV SOLN
10.0000 mL | Freq: Once | INTRAVENOUS | Status: AC
  Administered 2016-03-24: 10 mL via INTRAVENOUS

## 2016-11-26 ENCOUNTER — Other Ambulatory Visit: Payer: Self-pay | Admitting: Neurosurgery

## 2016-11-26 DIAGNOSIS — M5442 Lumbago with sciatica, left side: Principal | ICD-10-CM

## 2016-11-26 DIAGNOSIS — G8929 Other chronic pain: Secondary | ICD-10-CM

## 2016-12-04 ENCOUNTER — Telehealth: Payer: Self-pay | Admitting: Neurosurgery

## 2016-12-07 ENCOUNTER — Ambulatory Visit: Payer: Medicaid Other

## 2016-12-09 ENCOUNTER — Ambulatory Visit
Admission: RE | Admit: 2016-12-09 | Discharge: 2016-12-09 | Disposition: A | Discharge status: Home / Self Care | Payer: Medicaid Other | Source: Ambulatory Visit | Attending: Internal Medicine | Admitting: Internal Medicine

## 2016-12-09 ENCOUNTER — Other Ambulatory Visit: Payer: Self-pay | Admitting: Internal Medicine

## 2016-12-09 DIAGNOSIS — M25551 Pain in right hip: Secondary | ICD-10-CM

## 2016-12-09 DIAGNOSIS — M25552 Pain in left hip: Secondary | ICD-10-CM

## 2016-12-09 DIAGNOSIS — M549 Dorsalgia, unspecified: Secondary | ICD-10-CM | POA: Diagnosis not present

## 2016-12-09 DIAGNOSIS — I700 Atherosclerosis of aorta: Secondary | ICD-10-CM | POA: Insufficient documentation

## 2016-12-09 DIAGNOSIS — M419 Scoliosis, unspecified: Secondary | ICD-10-CM | POA: Diagnosis not present

## 2017-02-15 ENCOUNTER — Other Ambulatory Visit: Payer: Self-pay | Admitting: Neurosurgery

## 2017-02-16 ENCOUNTER — Other Ambulatory Visit: Payer: Self-pay | Admitting: Neurosurgery

## 2017-02-16 DIAGNOSIS — M5417 Radiculopathy, lumbosacral region: Secondary | ICD-10-CM

## 2017-02-19 ENCOUNTER — Ambulatory Visit
Admission: RE | Admit: 2017-02-19 | Discharge: 2017-02-19 | Disposition: A | Discharge status: Home / Self Care | Payer: Medicaid Other | Source: Ambulatory Visit | Attending: Neurosurgery | Admitting: Neurosurgery

## 2017-02-19 DIAGNOSIS — G8929 Other chronic pain: Secondary | ICD-10-CM

## 2017-02-19 DIAGNOSIS — M5417 Radiculopathy, lumbosacral region: Secondary | ICD-10-CM | POA: Insufficient documentation

## 2017-02-19 DIAGNOSIS — M5442 Lumbago with sciatica, left side: Secondary | ICD-10-CM

## 2017-03-18 ENCOUNTER — Other Ambulatory Visit
Admission: RE | Admit: 2017-03-18 | Discharge: 2017-03-18 | Disposition: A | Discharge status: Home / Self Care | Payer: Medicaid Other | Source: Ambulatory Visit | Attending: Internal Medicine | Admitting: Internal Medicine

## 2017-03-18 DIAGNOSIS — M961 Postlaminectomy syndrome, not elsewhere classified: Secondary | ICD-10-CM | POA: Diagnosis present

## 2017-03-18 LAB — POTASSIUM: Potassium: 3.8 mmol/L (ref 3.5–5.1)

## 2017-03-18 LAB — TROPONIN I: Troponin I: <0.03 ng/mL (ref <0.03)

## 2017-03-19 LAB — ALDOLASE: Aldolase: 2.9 U/L — ABNORMAL LOW (ref 3.3–10.3)

## 2017-04-15 ENCOUNTER — Ambulatory Visit: Payer: Medicaid Other | Admitting: Physical Therapy

## 2017-04-16 ENCOUNTER — Encounter: Payer: Self-pay | Admitting: Physical Therapy

## 2017-04-16 ENCOUNTER — Ambulatory Visit: Payer: Medicaid Other | Attending: Student | Admitting: Physical Therapy

## 2017-04-16 DIAGNOSIS — M791 Myalgia, unspecified site: Secondary | ICD-10-CM

## 2017-04-16 DIAGNOSIS — M4125 Other idiopathic scoliosis, thoracolumbar region: Secondary | ICD-10-CM | POA: Diagnosis present

## 2017-04-16 DIAGNOSIS — M545 Low back pain: Secondary | ICD-10-CM | POA: Insufficient documentation

## 2017-04-16 DIAGNOSIS — G8929 Other chronic pain: Secondary | ICD-10-CM

## 2017-04-16 NOTE — Patient Instructions (Addendum)
  To strengthen deep core muscles for scoliosis and weakness from back surgery : Deep core 1-2 ( handout)  Place a hand towel ( corner ) under R mid-low back to lay even on your back .   To improve lengthening of pelvic floor muscles with toileting: Abdominal massage upward from L,  R , center to belly button 3 stroke x 3 , pressure is gentle and light with all fingers flat, not using finger tips for the scar massage    -->>> semi circles from R lower abdomen to upper R, then to upper L and lower L  5 reps for the colon massage   Proper toileting technique:    To release pelvic floor muscle by the buttock bone ( ischial tuberosity)   Frog stretch: laying on belly with pillow under hips, knees bent, inhale do nothing, exhale let ankles fall apart  10 reps x 3

## 2017-04-16 NOTE — Therapy (Signed)
[facility] MAIN [facility] SERVICES [address] [place], [place], [address] Phone: [phone]   Fax:  [phone]  Physical Therapy Evaluation  Patient Details  Name: Whitney Rivera MRN: [id number] Date of Birth: [birth year] Referring Provider: Harmon Dun   Encounter Date: 04/16/2017      PT End of Session - 04/16/17 1148    Visit Number 1   Number of Visits 1   Date for PT Re-Evaluation 04/19/17   Authorization Type medicaid   PT Start Time 1100   PT Stop Time 1155   PT Time Calculation (min) 55 min   Activity Tolerance Patient tolerated treatment well;No increased pain   Behavior During Therapy WFL for tasks assessed/performed      Past Medical History:  Diagnosis Date  . CHF (congestive heart failure) (HCC)   . Chronic back pain   . Chronic constipation   . Hypertension   . Osteoporosis   . Scoliosis     Past Surgical History:  Procedure Laterality Date  . TUBAL LIGATION      There were no vitals filed for this visit.       Subjective Assessment - 04/16/17 1109    Subjective Pt started having constipation in 2011 after moving into a new house and drinking well water for a month. Pt has gone to the ER 2-3x across the past 7 years due to constipation.  Pt's last bowel movement occurred yesterday, and prior to that , 5 days.  Pt has abdominal pain is located in the middle above pubic bone which"doubles her over" .  9/10 level of pain without radiating pain. The pain makes her "sick to the point ishe felt she needed to throw up.  Pt has had night sweats that comes and goes and she does not know if it is menopause.  Pt had tubal ligation with surgery abdomen and back surgery at L4-5. 2) CLBP with radiating down the back to the L sitting bone and down to the posterior knee level and sometimes to the heel. Some days, pt is not able to do anything.      Pertinent History Pt has had chronic back pain and recently a LT L4-5  decompression hemilaminectomy and medial facetectomy/foraminotomy on 11/20/15. She currently has back precautions for no B/L/T per pt report. Pt initially had resolved symptoms after surgery, but has had gradually increasing back and L LE pain since. Pt had pain and N/T in low back and in L LE prior to surgery, but continues at this date.   Limitations Sitting;Lifting;Standing;Walking;House hold activities   How long can you sit comfortably? 30 minutes   How long can you stand comfortably? 10 minutes   How long can you walk comfortably? intermediate distances   Patient Stated Goals "I want to get better"   Pain Onset More than a month ago            [facility] PT Assessment - 04/16/17 1116      Assessment   Medical Diagnosis chronic constipation   Referring Provider Harmon Dun      Precautions   Precautions None     Restrictions   Weight Bearing Restrictions No     Balance Screen   Has the patient fallen in the past 6 months No     Coordination   Gross Motor Movements are Fluid and Coordinated --  diaphragmatic excursion noted,     Posture/Postural Control   Posture Comments l     Palpation  Spinal mobility R thoraco, R lumbar curve    SI assessment  standing R ASIS inferior,             Objective measurements completed on examination: See above findings.        Pelvic Floor Special Questions - 04/16/17 1144    External Perineal Exam through clothing   External Palpation L coccgyeus/ obt int tenderness/ tensions           [facility] Adult PT Treatment/Exercise - 04/16/17 1116      Neuro Re-ed    Neuro Re-ed Details  see pt instructions     Manual Therapy   Manual therapy comments STM release at L obt int/ coccgyeus L  - 7 min                 PT Education - 04/16/17 1150    Education provided Yes   Education Details HEP, POC, anatomy/ physiology, goals    Person(s) Educated Patient   Methods Explanation;Tactile cues;Demonstration;Verbal  cues;Handout   Comprehension Returned demonstration;Verbalized understanding          PT Short Term Goals - 04/16/17 1145      PT SHORT TERM GOAL #1   Title Pt wil demo no pelvic floor mm tensions at obt int and occygeus on L in order to improve pelvic floor lengthening to eliminate bowels   Time 1   Period Days   Status Achieved     PT SHORT TERM GOAL #2   Title Pt will demo no lumbopelvic perturbation with deep core level 2 exercise in order to improve postural stability/ motility and proper coordination of pelvic floor mm   Time 1   Period Weeks   Status Achieved     PT SHORT TERM GOAL #3   Title Pt will be IND with self abdominal massage to promote motility   Time 1   Status Achieved           PT Long Term Goals - 01/06/16 1155      PT LONG TERM GOAL #1   Title Pt will report maximal back pain of < 4/10 for increased activity tolerance within 8 weeks.   Baseline 7/10   Time 8   Period Weeks   Status New     PT LONG TERM GOAL #2   Title Pt will score <20% on ODI to increase ability to perform ADLs within 8 weeks.   Baseline 76%   Time 8   Period Weeks   Status New     PT LONG TERM GOAL #3   Title Pt will score <15 seconds on 5 x STS for reduced risk of falls within 8 weeks.   Baseline 25 seconds   Time 8   Period Weeks   Status New                Plan - 04/16/17 1150    Clinical Impression Statement Pt    Rehab Potential Good   Clinical Impairments Affecting Rehab Potential chronic pain   PT Frequency 2x / week   PT Duration 8 weeks   PT Treatment/Interventions Aquatic Therapy;Biofeedback;Radiation protection practitioner;Therapeutic activities;Therapeutic exercise;Balance training;Neuromuscular re-education;Patient/family education;Manual techniques   PT Next Visit Plan progress core and hip strengthening   PT Home Exercise Plan hooklying SKTC with knee extensions    Consulted and Agree with Plan of  Care Patient      Patient will benefit from skilled therapeutic intervention in order to improve the  following deficits and impairments:  Decreased activity tolerance, Decreased balance, Decreased range of motion, Hypomobility, Difficulty walking, Decreased strength, Increased muscle spasms, Impaired flexibility, Impaired sensation, Improper body mechanics, Pain  Visit Diagnosis: Chronic bilateral low back pain, with sciatica presence unspecified  Other idiopathic scoliosis, thoracolumbar region  Myalgia     Problem List There are no active problems to display for this patient.   Mariane Masters ,PT, DPT, E-RYT  04/16/2017, 11:56 AM  [facility] [place] [facility] MAIN [facility] SERVICES [address] [place], [place], [address] Phone: [phone]   Fax:  [phone]  Name: Saharra Santo MRN: [id number] Date of Birth: [birth year]

## 2017-10-14 ENCOUNTER — Ambulatory Visit: Payer: Medicare Other | Admitting: Neurology

## 2017-10-28 ENCOUNTER — Ambulatory Visit (INDEPENDENT_AMBULATORY_CARE_PROVIDER_SITE_OTHER): Payer: Medicare Other | Admitting: Neurology

## 2017-10-28 ENCOUNTER — Telehealth: Payer: Self-pay | Admitting: Neurology

## 2017-10-28 ENCOUNTER — Encounter: Payer: Self-pay | Admitting: Neurology

## 2017-10-28 VITALS — BP 108/72 | HR 69 | Wt 124.0 lb

## 2017-10-28 DIAGNOSIS — E538 Deficiency of other specified B group vitamins: Secondary | ICD-10-CM | POA: Diagnosis not present

## 2017-10-28 DIAGNOSIS — R5382 Chronic fatigue, unspecified: Secondary | ICD-10-CM

## 2017-10-28 DIAGNOSIS — R413 Other amnesia: Secondary | ICD-10-CM | POA: Insufficient documentation

## 2017-10-28 HISTORY — DX: Chronic fatigue, unspecified: R53.82

## 2017-10-28 HISTORY — DX: Other amnesia: R41.3

## 2017-10-28 NOTE — Telephone Encounter (Signed)
I schedule the [facility] for [facility] 11/08/17 arrival time is 10:30 AM. I called the patient phone number but she didn't pick up and it didn't go to any kind of voicemail. I was unable to leave this information about this appointment to the patient.
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
## 2017-10-29 ENCOUNTER — Telehealth: Payer: Self-pay | Admitting: Neurology

## 2017-10-29 LAB — HIV ANTIBODY (ROUTINE TESTING W REFLEX): HIV Screen 4th Generation wRfx: NONREACTIVE

## 2017-10-29 LAB — RPR: RPR Ser Ql: NONREACTIVE

## 2017-10-29 LAB — VITAMIN B12: VITAMIN B 12: 779 pg/mL (ref 232–1245)

## 2017-10-29 LAB — SEDIMENTATION RATE: Sed Rate: 2 mm/hr (ref 0–40)

## 2017-10-29 LAB — ACETYLCHOLINE RECEPTOR, BINDING

## 2017-10-29 LAB — B. BURGDORFI ANTIBODIES: Lyme IgG/IgM Ab: <0.91 {ISR} (ref 0.00–0.90)

## 2017-10-29 LAB — ANA W/REFLEX: Anti Nuclear Antibody(ANA): NEGATIVE

## 2017-10-29 NOTE — Telephone Encounter (Signed)
Pt called requesting MRI be done at [facility], stating she would like a call back once order has been sent

## 2017-11-08 ENCOUNTER — Ambulatory Visit: Payer: Self-pay

## 2017-11-09 DIAGNOSIS — M961 Postlaminectomy syndrome, not elsewhere classified: Secondary | ICD-10-CM | POA: Diagnosis not present

## 2017-11-09 DIAGNOSIS — G894 Chronic pain syndrome: Secondary | ICD-10-CM | POA: Diagnosis not present

## 2017-11-11 ENCOUNTER — Ambulatory Visit: Payer: Medicare Other

## 2018-02-02 DIAGNOSIS — M961 Postlaminectomy syndrome, not elsewhere classified: Secondary | ICD-10-CM | POA: Diagnosis not present

## 2018-02-02 DIAGNOSIS — G894 Chronic pain syndrome: Secondary | ICD-10-CM | POA: Diagnosis not present

## 2018-05-02 DIAGNOSIS — M961 Postlaminectomy syndrome, not elsewhere classified: Secondary | ICD-10-CM | POA: Diagnosis not present

## 2018-05-02 DIAGNOSIS — M5416 Radiculopathy, lumbar region: Secondary | ICD-10-CM | POA: Diagnosis not present

## 2018-05-02 DIAGNOSIS — M7918 Myalgia, other site: Secondary | ICD-10-CM | POA: Diagnosis not present

## 2018-05-04 ENCOUNTER — Ambulatory Visit: Payer: Medicare Other | Admitting: Neurology

## 2018-06-30 DIAGNOSIS — M5416 Radiculopathy, lumbar region: Secondary | ICD-10-CM | POA: Diagnosis not present

## 2018-06-30 DIAGNOSIS — M7918 Myalgia, other site: Secondary | ICD-10-CM | POA: Diagnosis not present

## 2018-06-30 DIAGNOSIS — M961 Postlaminectomy syndrome, not elsewhere classified: Secondary | ICD-10-CM | POA: Diagnosis not present

## 2018-09-29 DIAGNOSIS — G894 Chronic pain syndrome: Secondary | ICD-10-CM | POA: Diagnosis not present

## 2018-09-29 DIAGNOSIS — M961 Postlaminectomy syndrome, not elsewhere classified: Secondary | ICD-10-CM | POA: Diagnosis not present

## 2018-09-29 DIAGNOSIS — M5416 Radiculopathy, lumbar region: Secondary | ICD-10-CM | POA: Diagnosis not present

## 2018-11-02 ENCOUNTER — Ambulatory Visit: Payer: Medicare Other | Admitting: Nurse Practitioner

## 2018-11-14 ENCOUNTER — Ambulatory Visit: Payer: Medicare Other | Admitting: Nurse Practitioner

## 2018-11-24 ENCOUNTER — Other Ambulatory Visit: Payer: Self-pay

## 2018-11-24 NOTE — Patient Outreach (Signed)
[facility]) Care Management  11/24/2018  Bettelou Sloop [birth year] [id number]   Medication Adherence call to Mrs. Shawnice Tokar spoke with patient she did not want to give any personal information patient was very hesitance about our call  she ask if we can mail any information. Podgurski is showing past due on Lisinopril 20 mg under [employer].   Lillia Abed CPhT Pharmacy Technician [facility] Care Management Direct Dial [phone]  Fax [phone] [email or web address]

## 2018-12-01 ENCOUNTER — Other Ambulatory Visit: Payer: Self-pay

## 2018-12-01 ENCOUNTER — Ambulatory Visit: Payer: Medicare Other | Attending: Anesthesiology | Admitting: Anesthesiology

## 2018-12-01 ENCOUNTER — Encounter: Payer: Self-pay | Admitting: Anesthesiology

## 2018-12-01 DIAGNOSIS — R5382 Chronic fatigue, unspecified: Secondary | ICD-10-CM | POA: Diagnosis not present

## 2018-12-01 DIAGNOSIS — G894 Chronic pain syndrome: Secondary | ICD-10-CM | POA: Diagnosis not present

## 2018-12-01 DIAGNOSIS — F1190 Opioid use, unspecified, uncomplicated: Secondary | ICD-10-CM

## 2018-12-01 DIAGNOSIS — M5386 Other specified dorsopathies, lumbar region: Secondary | ICD-10-CM

## 2018-12-01 DIAGNOSIS — M4120 Other idiopathic scoliosis, site unspecified: Secondary | ICD-10-CM

## 2018-12-01 DIAGNOSIS — M47817 Spondylosis without myelopathy or radiculopathy, lumbosacral region: Secondary | ICD-10-CM

## 2018-12-01 DIAGNOSIS — M5136 Other intervertebral disc degeneration, lumbar region: Secondary | ICD-10-CM

## 2018-12-01 DIAGNOSIS — R413 Other amnesia: Secondary | ICD-10-CM | POA: Diagnosis not present

## 2018-12-01 MED ORDER — HYDROCODONE-ACETAMINOPHEN 7.5-325 MG PO TABS: 1.0000 | ORAL_TABLET | Freq: Three times a day (TID) | ORAL | 0 refills | Status: DC

## 2018-12-01 NOTE — Progress Notes (Signed)
Virtual Visit via Telephone Note  I connected with Whitney Rivera on 12/01/18 at 10:00 AM EDT by telephone and verified that I am speaking with the correct person using two identifiers.   I discussed the limitations, risks, security and privacy concerns of performing an evaluation and management service by telephone and the availability of in person appointments. I also discussed with the patient that there may be a patient responsible charge related to this service. The patient expressed understanding and agreed to proceed.   History of Present Illness:Pleasant 58 year old with long standing low back pain after surgery 3 years ago.  She now has severe and incapacitating pain in her low back with associiaed scoliosis.  She describes weakness and persistent pain.  The only meds that have given her relief were the oxycodone she took back in the fall.  She denies side effects with those and describes good relief without side effects.  She does have chronic constipation but it was no worse with these.  No PT helped and when on opioids she was more active and that seemed to help some.  Otherwise her sleep is poor and she has been inactive and is quite saddened by this.      Observations/Objective:   Assessment and Plan: 1. Memory difficulty   2. Chronic fatigue   3. Chronic pain syndrome   4. Low back derangement syndrome   5. Scoliosis (and kyphoscoliosis), idiopathic   6. Chronic, continuous use of opioids   7. DDD (degenerative disc disease), lumbar   8. Facet arthritis of lumbosacral region   Will initiate low dose meds as called in today and after revies of the pdmp.  We will schedule her for a return to clinic in one month after corona situation abates and inititate long term care.   Opioid clinic policy discusssed and reviewed and accepted.  She is to continue follow-up with her primary care physicians JA   Follow Up Instructions:    I discussed the assessment and treatment plan with the  patient. The patient was provided an opportunity to ask questions and all were answered. The patient agreed with the plan and demonstrated an understanding of the instructions.   The patient was advised to call back or seek an in-person evaluation if the symptoms worsen or if the condition fails to improve as anticipated.  I provided 30 minutes of non-face-to-face time during this encounter.   [name], MD

## 2018-12-06 ENCOUNTER — Telehealth: Payer: Self-pay | Admitting: Anesthesiology

## 2018-12-06 ENCOUNTER — Other Ambulatory Visit: Payer: Self-pay

## 2018-12-06 ENCOUNTER — Ambulatory Visit: Payer: Medicare Other | Attending: Anesthesiology | Admitting: Anesthesiology

## 2018-12-06 ENCOUNTER — Encounter: Payer: Self-pay | Admitting: Anesthesiology

## 2018-12-06 DIAGNOSIS — M5386 Other specified dorsopathies, lumbar region: Secondary | ICD-10-CM | POA: Diagnosis not present

## 2018-12-06 DIAGNOSIS — M5136 Other intervertebral disc degeneration, lumbar region: Secondary | ICD-10-CM | POA: Diagnosis not present

## 2018-12-06 DIAGNOSIS — M4120 Other idiopathic scoliosis, site unspecified: Secondary | ICD-10-CM | POA: Diagnosis not present

## 2018-12-06 DIAGNOSIS — G894 Chronic pain syndrome: Secondary | ICD-10-CM | POA: Diagnosis not present

## 2018-12-06 DIAGNOSIS — F1190 Opioid use, unspecified, uncomplicated: Secondary | ICD-10-CM

## 2018-12-06 DIAGNOSIS — M47817 Spondylosis without myelopathy or radiculopathy, lumbosacral region: Secondary | ICD-10-CM

## 2018-12-06 MED ORDER — OXYCODONE HCL 5 MG PO TABS: 5.0000 mg | ORAL_TABLET | Freq: Three times a day (TID) | ORAL | 0 refills | Status: DC

## 2018-12-06 NOTE — Telephone Encounter (Signed)
Patient having side effects with NORCO. Instructed she needed to have e-visit with Dr. Pernell Dupre. Put on schedule for today with Dr. Pernell Dupre.

## 2018-12-06 NOTE — Progress Notes (Signed)
Virtual Visit via Telephone Note  I connected with Whitney Rivera on 12/06/18 at  2:00 PM EDT by telephone and verified that I am speaking with the correct person using two identifiers.   I discussed the limitations, risks, security and privacy concerns of performing an evaluation and management service by telephone and the availability of in person appointments. I also discussed with the patient that there may be a patient responsible charge related to this service. The patient expressed understanding and agreed to proceed.   History of Present Illness: Patient is getting headaches and nausea from the Vicodin prescription and desires to change to oxycodone which worked better for her before.  Previously she had been on a 10 mg tablet but desires to restart on a 5 mg tablet to minimize her dose.  She denies any diverting or illicit use.    Observations/Objective:   Assessment and Plan: 1. Chronic pain syndrome   2. Low back derangement syndrome   3. Scoliosis (and kyphoscoliosis), idiopathic   4. Chronic, continuous use of opioids   5. DDD (degenerative disc disease), lumbar   6. Facet arthritis of lumbosacral region   I am scheduling her to return the hydrocodone medication to the clinic at which point she can pick up a refill prescription for oxycodone 5 mg to be taken 1 p.o. 3 times daily as needed.  We have gone over the risks and benefits of opioid therapy once again over the phone today and we will schedule her for return in 1 month.  Follow Up Instructions:    I discussed the assessment and treatment plan with the patient. The patient was provided an opportunity to ask questions and all were answered. The patient agreed with the plan and demonstrated an understanding of the instructions.   The patient was advised to call back or seek an in-person evaluation if the symptoms worsen or if the condition fails to improve as anticipated.  I provided 10 minutes of non-face-to-face time  during this encounter.   [name], MD

## 2018-12-06 NOTE — Telephone Encounter (Signed)
Pt called and stated that Dr Pernell Dupre prescribed her oxy acept and that medication is giving her a nausea headache and would like something else prescribed that does not have tylenol in it

## 2018-12-07 NOTE — Progress Notes (Signed)
Nursing Pain Medication Assessment:  Safety precautions to be maintained throughout the outpatient stay will include: orient to surroundings, keep bed in low position, maintain call bell within reach at all times, provide assistance with transfer out of bed and ambulation.  Medication Inspection Compliance: Pill count conducted under aseptic conditions, in front of the patient. Neither the pills nor the bottle was removed from the patient's sight at any time. Once count was completed pills were immediately returned to the patient in their original bottle.  Medication: Hydrocodone/APAP Pill/Patch Count: 72.5 of 75 pills remain Pill/Patch Appearance: Markings consistent with prescribed medication Bottle Appearance: Standard pharmacy container. Clearly labeled. Filled Date: 04 / 09 / 2020 Last Medication intake:   Wasted in toilet with Earlie Lou and daughter

## 2019-01-05 ENCOUNTER — Encounter: Payer: Self-pay | Admitting: Anesthesiology

## 2019-01-05 ENCOUNTER — Other Ambulatory Visit: Payer: Self-pay

## 2019-01-05 ENCOUNTER — Ambulatory Visit: Payer: Medicare Other | Attending: Anesthesiology | Admitting: Anesthesiology

## 2019-01-05 DIAGNOSIS — G894 Chronic pain syndrome: Secondary | ICD-10-CM | POA: Diagnosis not present

## 2019-01-05 DIAGNOSIS — M47817 Spondylosis without myelopathy or radiculopathy, lumbosacral region: Secondary | ICD-10-CM

## 2019-01-05 DIAGNOSIS — M5386 Other specified dorsopathies, lumbar region: Secondary | ICD-10-CM | POA: Diagnosis not present

## 2019-01-05 DIAGNOSIS — M5136 Other intervertebral disc degeneration, lumbar region: Secondary | ICD-10-CM

## 2019-01-05 DIAGNOSIS — M4120 Other idiopathic scoliosis, site unspecified: Secondary | ICD-10-CM

## 2019-01-05 DIAGNOSIS — R5382 Chronic fatigue, unspecified: Secondary | ICD-10-CM

## 2019-01-05 DIAGNOSIS — F1190 Opioid use, unspecified, uncomplicated: Secondary | ICD-10-CM

## 2019-01-05 MED ORDER — OXYCODONE HCL 5 MG PO TABS
5.0000 mg | ORAL_TABLET | Freq: Three times a day (TID) | ORAL | 0 refills | Status: DC
Start: 2019-01-06 — End: 2019-06-08

## 2019-01-05 NOTE — Progress Notes (Signed)
Virtual Visit via Telephone Note  I connected with Whitney Rivera on 01/05/19 at 11:30 AM EDT by telephone and verified that I am speaking with the correct person using two identifiers.  Location: Patient: Home   Provider: Pain control center     I discussed the limitations, risks, security and privacy concerns of performing an evaluation and management service by telephone and the availability of in person appointments. I also discussed with the patient that there may be a patient responsible charge related to this service. The patient expressed understanding and agreed to proceed.   History of Present Illness:I spoke with Whitney Rivera regarding her low back pain and leg pain today over the phone.  Unfortunately she has recently thrown her back out and also has experienced a recent tick bite.  She is concerned about the risk of other associated illness and I have encourtaged her to follow up with her primary care doctor or seek ER asssistance.  Otherwise she is doing well with her change of pain medication and getting good relief when taking the percocet.  No other changes to bowel or bladder function are reported and the quality of the pain has been stable.       Observations/Objective:  Current Outpatient Medications:  .  aspirin EC 81 MG tablet, Take 81 mg by mouth daily., Disp: , Rfl:  .  cyclobenzaprine (FLEXERIL) 5 MG tablet, Take 1 tablet by mouth daily., Disp: , Rfl:  .  HYDROcodone-acetaminophen (NORCO) 7.5-325 MG tablet, Take 1 tablet by mouth 3 (three) times daily., Disp: 75 tablet, Rfl: 0 .  lisinopril (PRINIVIL,ZESTRIL) 2.5 MG tablet, Take 2.5 mg by mouth daily., Disp: , Rfl:  .  metoprolol tartrate (LOPRESSOR) 50 MG tablet, Take 50 mg by mouth daily., Disp: , Rfl:  .  [START ON 01/06/2019] oxyCODONE (ROXICODONE) 5 MG immediate release tablet, Take 1 tablet (5 mg total) by mouth 3 (three) times daily for 30 days., Disp: 90 tablet, Rfl: 0   Assessment and Plan: 1. Chronic pain syndrome    2. Low back derangement syndrome   3. Scoliosis (and kyphoscoliosis), idiopathic   4. Chronic, continuous use of opioids   5. DDD (degenerative disc disease), lumbar   6. Facet arthritis of lumbosacral region   7. Chronic fatigue   I have reviewed the practioncer data  Base and it is appropriate to refill her medications for May 15 and these will be called in.  She is to return for reevaluation in 1 month and I have encouraged her to see her primary care doctor for evaluation of the tick bite.     Follow Up Instructions: 1 month    I discussed the assessment and treatment plan with the patient. The patient was provided an opportunity to ask questions and all were answered. The patient agreed with the plan and demonstrated an understanding of the instructions.   The patient was advised to call back or seek an in-person evaluation if the symptoms worsen or if the condition fails to improve as anticipated.  I provided 20 minutes of non-face-to-face time during this encounter.   [name], MD

## 2019-02-10 ENCOUNTER — Encounter: Payer: Self-pay | Admitting: Anesthesiology

## 2019-02-10 ENCOUNTER — Ambulatory Visit: Payer: Medicare Other | Attending: Anesthesiology | Admitting: Anesthesiology

## 2019-02-10 ENCOUNTER — Other Ambulatory Visit: Payer: Self-pay

## 2019-02-10 DIAGNOSIS — M51369 Other intervertebral disc degeneration, lumbar region without mention of lumbar back pain or lower extremity pain: Secondary | ICD-10-CM | POA: Insufficient documentation

## 2019-02-10 DIAGNOSIS — G894 Chronic pain syndrome: Secondary | ICD-10-CM

## 2019-02-10 DIAGNOSIS — F1190 Opioid use, unspecified, uncomplicated: Secondary | ICD-10-CM | POA: Diagnosis not present

## 2019-02-10 DIAGNOSIS — M4186 Other forms of scoliosis, lumbar region: Secondary | ICD-10-CM | POA: Insufficient documentation

## 2019-02-10 DIAGNOSIS — M47816 Spondylosis without myelopathy or radiculopathy, lumbar region: Secondary | ICD-10-CM

## 2019-02-10 DIAGNOSIS — M5136 Other intervertebral disc degeneration, lumbar region: Secondary | ICD-10-CM

## 2019-02-10 DIAGNOSIS — R5382 Chronic fatigue, unspecified: Secondary | ICD-10-CM

## 2019-02-10 DIAGNOSIS — M5386 Other specified dorsopathies, lumbar region: Secondary | ICD-10-CM

## 2019-02-10 MED ORDER — OXYCODONE HCL 10 MG PO TABS: 10.0000 mg | ORAL_TABLET | Freq: Two times a day (BID) | ORAL | 0 refills | Status: DC

## 2019-02-10 NOTE — Progress Notes (Signed)
Virtual Visit via Telephone Note  I connected with Whitney Rivera on 02/10/19 at  9:45 AM EDT by telephone and verified that I am speaking with the correct person using two identifiers.  Location: Patient: Home Provider: Pain control center   I discussed the limitations, risks, security and privacy concerns of performing an evaluation and management service by telephone and the availability of in person appointments. I also discussed with the patient that there may be a patient responsible charge related to this service. The patient expressed understanding and agreed to proceed.   History of Present Illness: I spoke with Whitney Rivera today regarding her care.  This is via telephone conferencing as she was unable to do the video virtual.  She states that she is doing better when she takes 2 of the 5 mg tablets once a day and 1 of the 5 mg tablets at bedtime.  This combination seems to give her better pain control throughout the day.  Otherwise the quality characteristic and distribution of her low back pain and leg pain is stable in nature without significant changes mentioned today.  Her lower extremity strength is at baseline with no change in bowel or bladder function noted.  She is taking her medications with good relief no side effects are reported.    Observations/Objective:  Current Outpatient Medications:  .  aspirin EC 81 MG tablet, Take 81 mg by mouth daily., Disp: , Rfl:  .  cyclobenzaprine (FLEXERIL) 5 MG tablet, Take 1 tablet by mouth daily., Disp: , Rfl:  .  HYDROcodone-acetaminophen (NORCO) 7.5-325 MG tablet, Take 1 tablet by mouth 3 (three) times daily., Disp: 75 tablet, Rfl: 0 .  lisinopril (PRINIVIL,ZESTRIL) 2.5 MG tablet, Take 2.5 mg by mouth daily., Disp: , Rfl:  .  metoprolol tartrate (LOPRESSOR) 50 MG tablet, Take 50 mg by mouth daily., Disp: , Rfl:  .  oxyCODONE (ROXICODONE) 5 MG immediate release tablet, Take 1 tablet (5 mg total) by mouth 3 (three) times daily for 30 days., Disp:  90 tablet, Rfl: 0 .  Oxycodone HCl 10 MG TABS, Take 1 tablet (10 mg total) by mouth 2 (two) times a day for 30 days., Disp: 60 tablet, Rfl: 0 .  [START ON 03/12/2019] Oxycodone HCl 10 MG TABS, Take 1 tablet (10 mg total) by mouth 2 (two) times a day for 30 days., Disp: 60 tablet, Rfl: 0   Assessment and Plan: 1. Chronic fatigue   2. Other forms of scoliosis, lumbar region   3. Chronic pain syndrome   4. Chronic, continuous use of opioids   5. DDD (degenerative disc disease), lumbar   6. Facet arthropathy, lumbar   7. Low back derangement syndrome   Based upon our discussion today and upon review of the [facility] practitioner database information I am going to refill her medications.  In the past she has been on a 10 mg tablet and this worked well for her.  I want to switch her from the 5 mg to the 10 mg tablets.  She can take one half up to 1 tablet twice a day.  I believe this regimen will work effectively for her based on our discussion today.  She denies side effects with the medications.  She is to continue with stretching strengthening exercises and return to clinic in 2 months for reevaluation.  In the meantime she is to continue follow-up with her primary care physician for baseline medical care.  Follow Up Instructions:    I discussed the assessment  and treatment plan with the patient. The patient was provided an opportunity to ask questions and all were answered. The patient agreed with the plan and demonstrated an understanding of the instructions.   The patient was advised to call back or seek an in-person evaluation if the symptoms worsen or if the condition fails to improve as anticipated.  I provided 30 minutes of non-face-to-face time during this encounter.   Yevette EdwardsJames G Adams, MD

## 2019-02-13 ENCOUNTER — Telehealth: Payer: Self-pay | Admitting: Anesthesiology

## 2019-02-13 NOTE — Telephone Encounter (Signed)
Pt left a voicemail stating she would like her medication sent to [facility] not [facility]

## 2019-03-09 ENCOUNTER — Telehealth: Payer: Self-pay | Admitting: Anesthesiology

## 2019-03-09 NOTE — Telephone Encounter (Signed)
Patient went in under MyChart and changed her pharmacy to [facility]. She took of the [facility] on [address]. Just FYI

## 2019-04-06 ENCOUNTER — Other Ambulatory Visit: Payer: Self-pay

## 2019-04-06 ENCOUNTER — Ambulatory Visit: Payer: Medicare Other | Attending: Anesthesiology | Admitting: Anesthesiology

## 2019-04-06 ENCOUNTER — Encounter: Payer: Self-pay | Admitting: Anesthesiology

## 2019-04-06 DIAGNOSIS — G894 Chronic pain syndrome: Secondary | ICD-10-CM

## 2019-04-06 DIAGNOSIS — R5382 Chronic fatigue, unspecified: Secondary | ICD-10-CM

## 2019-04-06 DIAGNOSIS — F1190 Opioid use, unspecified, uncomplicated: Secondary | ICD-10-CM | POA: Diagnosis not present

## 2019-04-06 DIAGNOSIS — M4120 Other idiopathic scoliosis, site unspecified: Secondary | ICD-10-CM

## 2019-04-06 DIAGNOSIS — M47816 Spondylosis without myelopathy or radiculopathy, lumbar region: Secondary | ICD-10-CM

## 2019-04-06 DIAGNOSIS — M5136 Other intervertebral disc degeneration, lumbar region: Secondary | ICD-10-CM | POA: Diagnosis not present

## 2019-04-06 DIAGNOSIS — M5386 Other specified dorsopathies, lumbar region: Secondary | ICD-10-CM

## 2019-04-06 MED ORDER — OXYCODONE HCL 10 MG PO TABS: 10.0000 mg | ORAL_TABLET | Freq: Two times a day (BID) | ORAL | 0 refills | Status: DC

## 2019-04-06 NOTE — Progress Notes (Signed)
Virtual Visit via Telephone Note  I connected with Whitney Rivera on 04/06/19 at  2:30 PM EDT by telephone and verified that I am speaking with the correct person using two identifiers.  Location: Patient: Home Provider: Pain control center   I discussed the limitations, risks, security and privacy concerns of performing an evaluation and management service by telephone and the availability of in person appointments. I also discussed with the patient that there may be a patient responsible charge related to this service. The patient expressed understanding and agreed to proceed.   History of Present Illness: I spoke with Whitney Rivera regarding her low back pain and lower extremity pain today.  She states that she is doing much better taking the medications as prescribed twice a day and also has changed her diet over to a low inflammatory type diet which she feels is helping.  Overall she is doing more stretching strengthening exercises and getting good relief with this.  She has been on more conservative medication management in the past without success.  At present she describes an aching gnawing pain in the low back worse in the morning after she just gets up and better with relief with the medications.  Otherwise she is in her usual state of health with no other contributory changes today.    Observations/Objective: Current Outpatient Medications:  .  aspirin EC 81 MG tablet, Take 81 mg by mouth daily., Disp: , Rfl:  .  cyclobenzaprine (FLEXERIL) 5 MG tablet, Take 1 tablet by mouth daily., Disp: , Rfl:  .  HYDROcodone-acetaminophen (NORCO) 7.5-325 MG tablet, Take 1 tablet by mouth 3 (three) times daily., Disp: 75 tablet, Rfl: 0 .  lisinopril (PRINIVIL,ZESTRIL) 2.5 MG tablet, Take 2.5 mg by mouth daily., Disp: , Rfl:  .  metoprolol tartrate (LOPRESSOR) 50 MG tablet, Take 50 mg by mouth daily., Disp: , Rfl:  .  oxyCODONE (ROXICODONE) 5 MG immediate release tablet, Take 1 tablet (5 mg total) by  mouth 3 (three) times daily for 30 days., Disp: 90 tablet, Rfl: 0 .  Oxycodone HCl 10 MG TABS, Take 1 tablet (10 mg total) by mouth 2 (two) times a day for 30 days., Disp: 60 tablet, Rfl: 0 .  [START ON 04/11/2019] Oxycodone HCl 10 MG TABS, Take 1 tablet (10 mg total) by mouth 2 (two) times daily., Disp: 60 tablet, Rfl: 0   Assessment and Plan:  1. Chronic fatigue   2. Chronic pain syndrome   3. Chronic, continuous use of opioids   4. DDD (degenerative disc disease), lumbar   5. Facet arthropathy, lumbar   6. Low back derangement syndrome   7. Scoliosis (and kyphoscoliosis), idiopathic   Based on our discussion today and upon review of the [facility] practitioner database information we will refill her medications for the next month dated August 18.  We will schedule her for return to clinic at that time into 1 month.  And I want her to continue with her current regimen.  She is also been instructed to continue with exercises and stretching strengthening as tolerated.  Ultimately she may be a candidate for an epidural steroid injection as discussed.  She is to continue follow-up with her primary care physicians for baseline medical care.   Follow Up Instructions:    I discussed the assessment and treatment plan with the patient. The patient was provided an opportunity to ask questions and all were answered. The patient agreed with the plan and demonstrated an understanding of the  instructions.   The patient was advised to call back or seek an in-person evaluation if the symptoms worsen or if the condition fails to improve as anticipated.  I provided 30 minutes of non-face-to-face time during this encounter.   Molli Barrows, MD

## 2019-05-10 ENCOUNTER — Encounter: Payer: Self-pay | Admitting: Anesthesiology

## 2019-05-10 ENCOUNTER — Other Ambulatory Visit: Payer: Self-pay

## 2019-05-10 ENCOUNTER — Ambulatory Visit: Payer: Medicare Other | Attending: Anesthesiology | Admitting: Anesthesiology

## 2019-05-10 DIAGNOSIS — M5386 Other specified dorsopathies, lumbar region: Secondary | ICD-10-CM

## 2019-05-10 DIAGNOSIS — M5136 Other intervertebral disc degeneration, lumbar region: Secondary | ICD-10-CM | POA: Diagnosis not present

## 2019-05-10 DIAGNOSIS — G894 Chronic pain syndrome: Secondary | ICD-10-CM | POA: Diagnosis not present

## 2019-05-10 DIAGNOSIS — F1190 Opioid use, unspecified, uncomplicated: Secondary | ICD-10-CM | POA: Diagnosis not present

## 2019-05-10 DIAGNOSIS — M4120 Other idiopathic scoliosis, site unspecified: Secondary | ICD-10-CM

## 2019-05-10 DIAGNOSIS — R5382 Chronic fatigue, unspecified: Secondary | ICD-10-CM | POA: Diagnosis not present

## 2019-05-10 DIAGNOSIS — M47816 Spondylosis without myelopathy or radiculopathy, lumbar region: Secondary | ICD-10-CM

## 2019-05-10 DIAGNOSIS — M961 Postlaminectomy syndrome, not elsewhere classified: Secondary | ICD-10-CM

## 2019-05-10 HISTORY — DX: Postlaminectomy syndrome, not elsewhere classified: M96.1

## 2019-05-10 MED ORDER — OXYCODONE HCL 10 MG PO TABS: 10.0000 mg | ORAL_TABLET | Freq: Two times a day (BID) | ORAL | 0 refills | Status: DC

## 2019-05-10 NOTE — Progress Notes (Signed)
Virtual Visit via Telephone Note  I connected with [name] on 05/10/19 at  2:15 PM EDT by telephone and verified that I am speaking with the correct person using two identifiers.  Location: Patient: Home Provider: Pain control center   I discussed the limitations, risks, security and privacy concerns of performing an evaluation and management service by telephone and the availability of in person appointments. I also discussed with the patient that there may be a patient responsible charge related to this service. The patient expressed understanding and agreed to proceed.   History of Present Illness: I spoke with Whitney Rivera regarding her low back pain.  She continues to have pain throughout the day but this is generally well controlled with her current Percocet twice a day dosing.  She gets 8 to 10 hours of relief with the medication but frequently has breakthrough pain at night.  No change in the quality characteristic or distribution are noted and her strength in lower extremities has remained stable.  She has been more active recently and she attributes this to some of her recent breakthrough pain.  She is doing her exercises as tolerated with some limited success.  She continues to take aspirin for breakthrough pain and generally takes this twice a day.  Otherwise she is in her usual state of health no new significant changes reported today    Observations/Objective:Marland Kitchen Current Outpatient Medications:  .  aspirin EC 81 MG tablet, Take 81 mg by mouth daily., Disp: , Rfl:  .  cyclobenzaprine (FLEXERIL) 5 MG tablet, Take 1 tablet by mouth daily., Disp: , Rfl:  .  HYDROcodone-acetaminophen (NORCO) 7.5-325 MG tablet, Take 1 tablet by mouth 3 (three) times daily., Disp: 75 tablet, Rfl: 0 .  lisinopril (PRINIVIL,ZESTRIL) 2.5 MG tablet, Take 2.5 mg by mouth daily., Disp: , Rfl:  .  metoprolol tartrate (LOPRESSOR) 50 MG tablet, Take 50 mg by mouth daily., Disp: , Rfl:  .  oxyCODONE (ROXICODONE)  5 MG immediate release tablet, Take 1 tablet (5 mg total) by mouth 3 (three) times daily for 30 days., Disp: 90 tablet, Rfl: 0 .  Oxycodone HCl 10 MG TABS, Take 1 tablet (10 mg total) by mouth 2 (two) times a day for 30 days., Disp: 60 tablet, Rfl: 0 .  [START ON 05/12/2019] Oxycodone HCl 10 MG TABS, Take 1 tablet (10 mg total) by mouth 2 (two) times daily., Disp: 60 tablet, Rfl: 0   Assessment and Plan: 1. Chronic fatigue   2. Chronic pain syndrome   3. Chronic, continuous use of opioids   4. DDD (degenerative disc disease), lumbar   5. Facet arthropathy, lumbar   6. Low back derangement syndrome   7. Scoliosis (and kyphoscoliosis), idiopathic   8. Lumbar post-laminectomy syndrome   Based on review of her symptom complex and current response to therapy I am going to refill her medication for September 18.  I want her to continue with the same current dosing and continue with her aerobic conditioning and exercises.  She is scheduled for return to clinic in 1 month at which point we may discuss increasing her dosing frequency if necessary.  We have also talked about opportunities with possible caudal epidural steroid injections as previously reviewed and discussed to see if this can help keep her pain under better control.  She is to continue follow-up with her primary care physicians for baseline medical care.   Follow Up Instructions:    I discussed the assessment and treatment plan with  the patient. The patient was provided an opportunity to ask questions and all were answered. The patient agreed with the plan and demonstrated an understanding of the instructions.   The patient was advised to call back or seek an in-person evaluation if the symptoms worsen or if the condition fails to improve as anticipated.  I provided 30 minutes of non-face-to-face time during this encounter.   Molli Barrows, MD

## 2019-06-06 IMAGING — MR MR SACRUM / SI JOINTS WO CM
4 of 5 series · 27 of 48 positions shown · non-contrast
Comparison: Lumbar radiographs dated 12/09/2016

CLINICAL DATA: Low back pain and lower extremity pain bilateral leg
pain and numbness. Pelvic pain and tingling in the left leg.
Lumbosacral radiculitis.

EXAM:
MRI SACRUM WITHOUT CONTRAST
TECHNIQUE: Multiplanar, multisequence MR imaging of the sacrum was performed.
No intravenous contrast was administered.

[Series 3: T1 · axial · 5.0mm · 1.02mm/px · z∈[-111,+42]mm · 11 of 28 slices shown (1 of 3)]
[im 1/28]
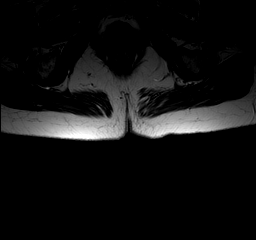
[im 3/28]
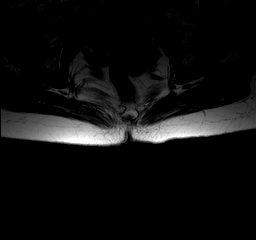
[im 6/28]
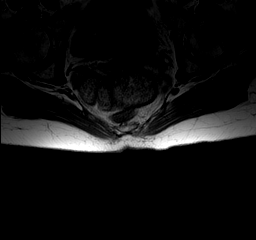
[im 9/28]
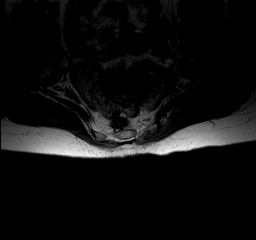
[im 11/28]
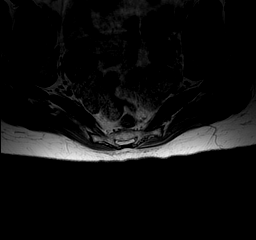
[im 14/28]
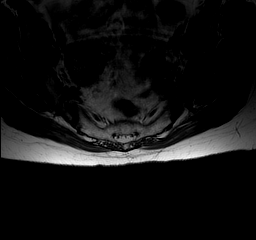
[im 17/28]
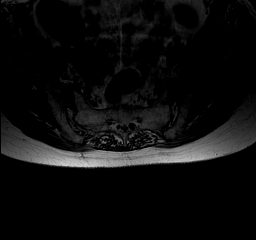
[im 19/28]
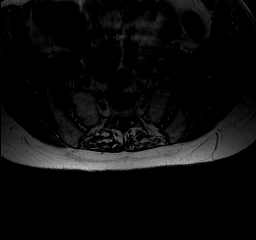
[im 22/28]
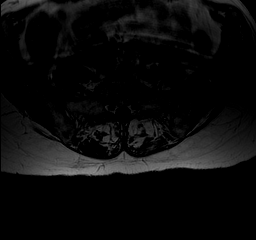
[im 25/28]
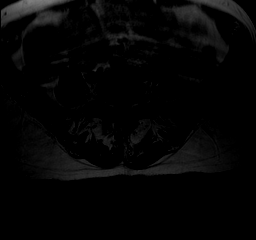
[im 28/28]
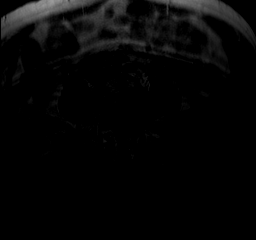

[Series 4: T2 fat-sat · axial · 5.0mm · 0.51mm/px · z∈[-117,+19]mm · 10 of 28 slices shown]
[im 1/28]
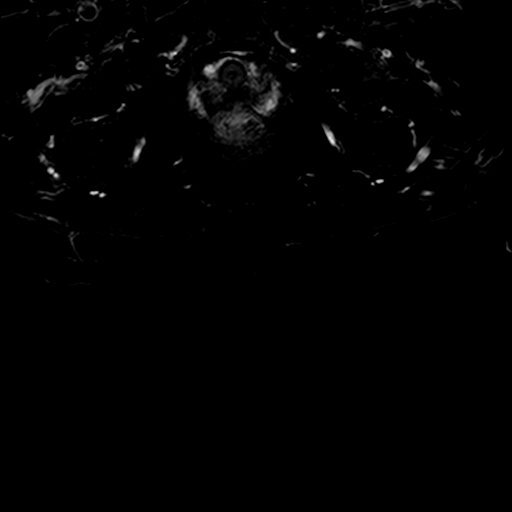
[im 3/28]
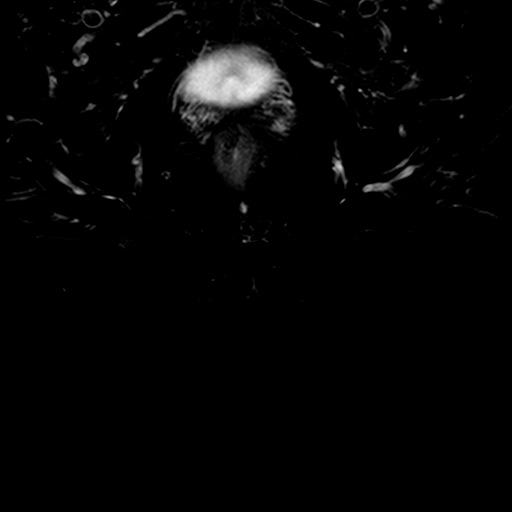
[im 6/28]
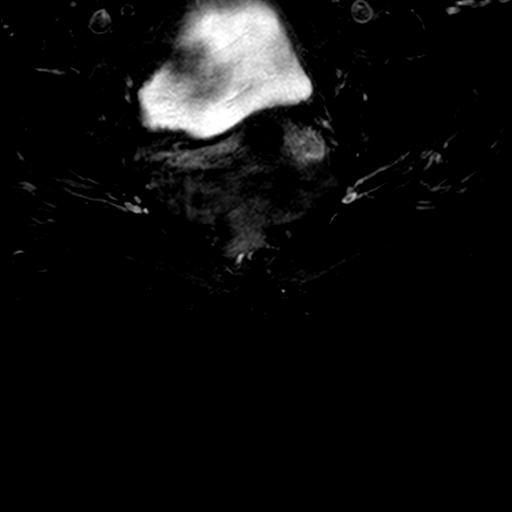
[im 9/28]
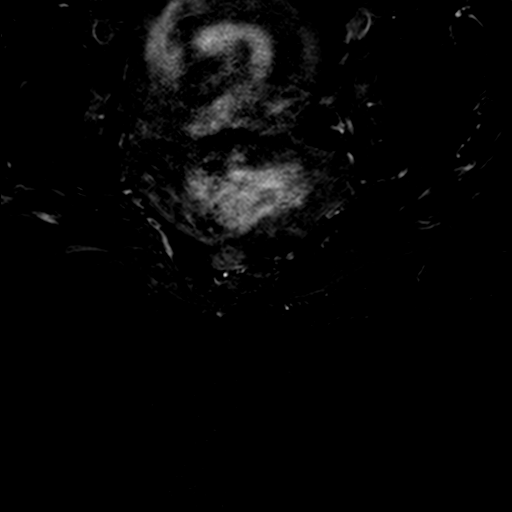
[im 11/28]
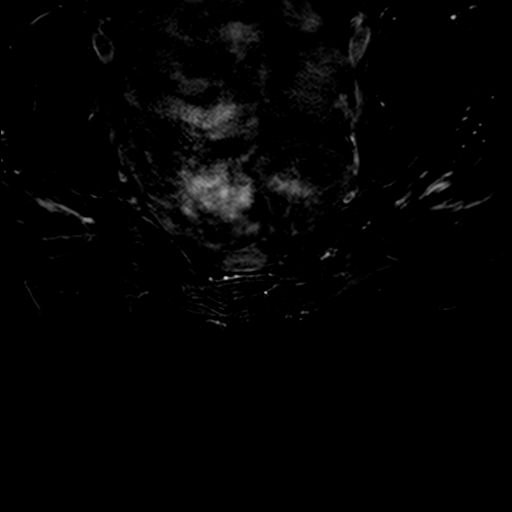
[im 14/28]
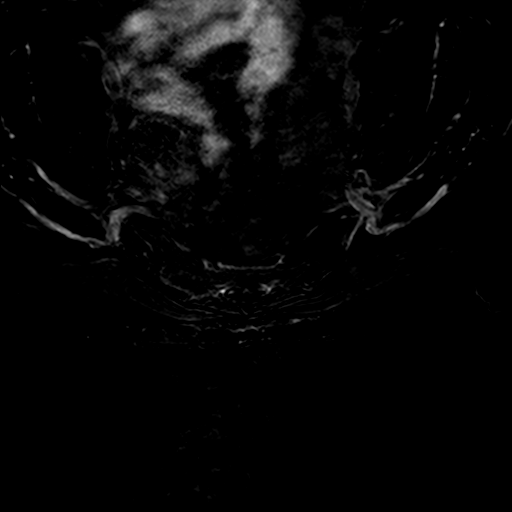
[im 17/28]
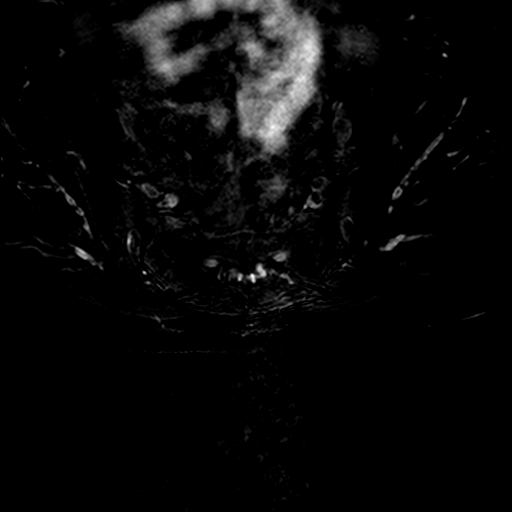
[im 19/28]
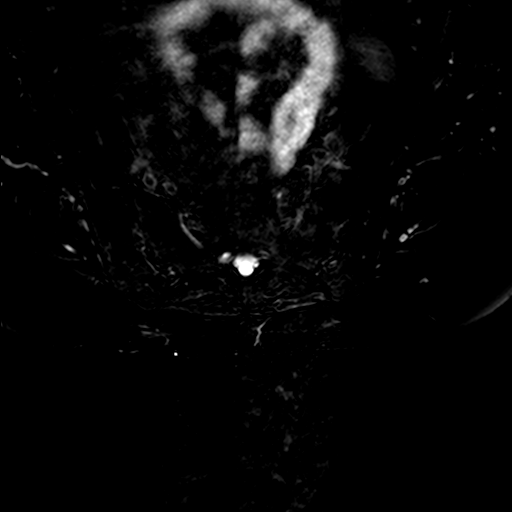
[im 22/28]
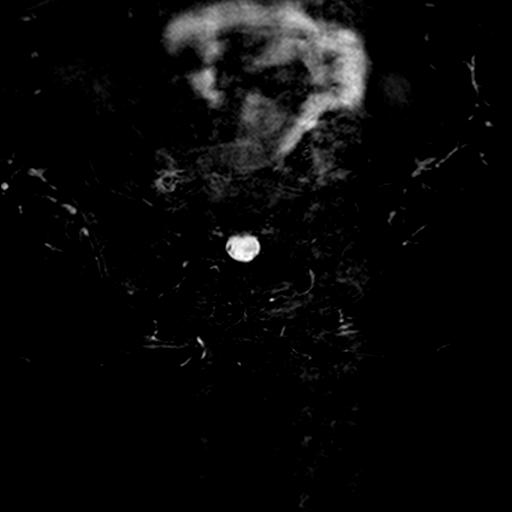
[im 25/28]
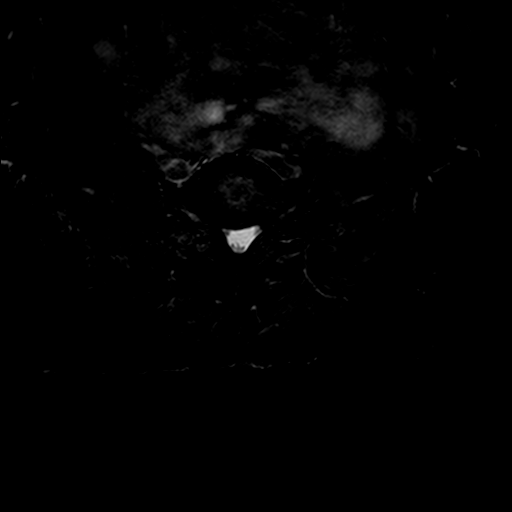

[Series 6: T1 · sagittal · 5.0mm · 0.49mm/px · 3 of 28 slices shown (2 of 3)]
[im 3/28]
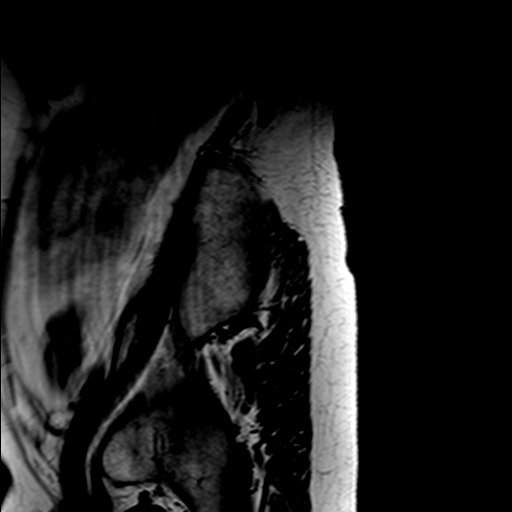
[im 14/28]
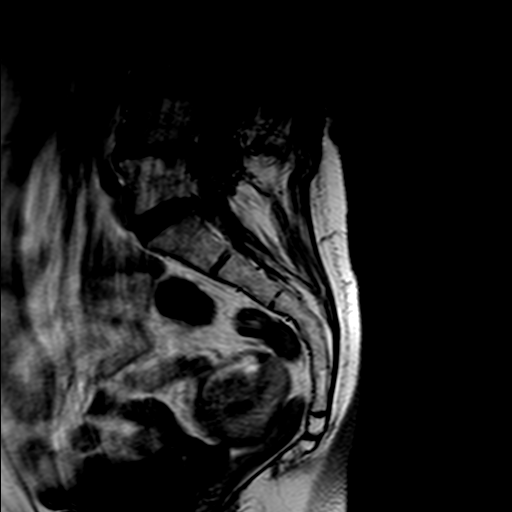
[im 25/28]
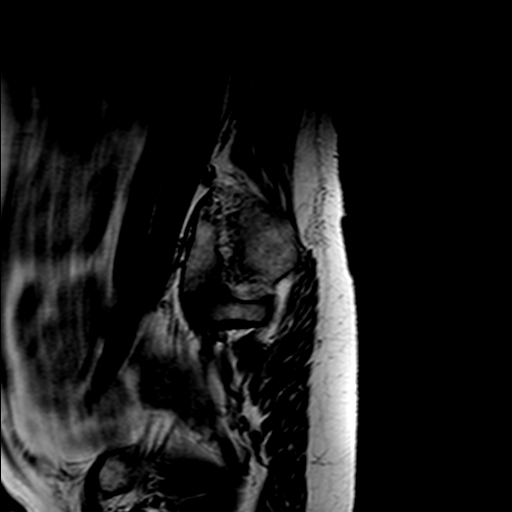

[Series 7: T1 · oblique · 4.0mm · 0.55mm/px · 3 of 20 slices shown (3 of 3)]
[im 3/20]
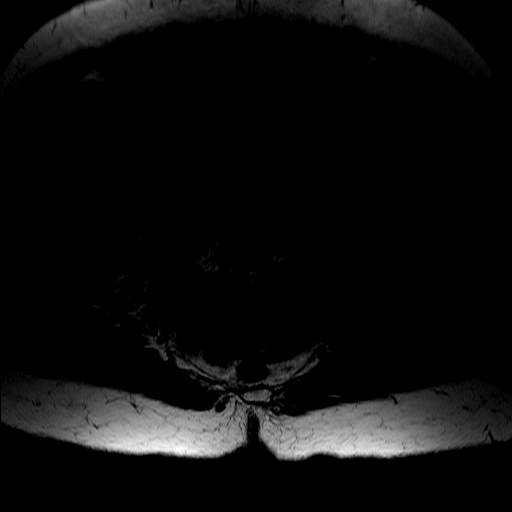
[im 11/20]
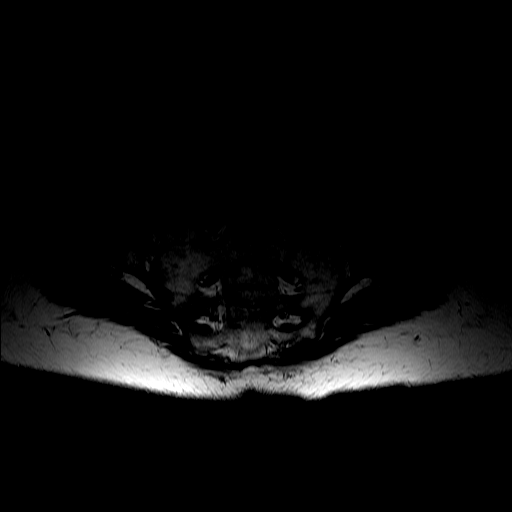
[im 17/20]
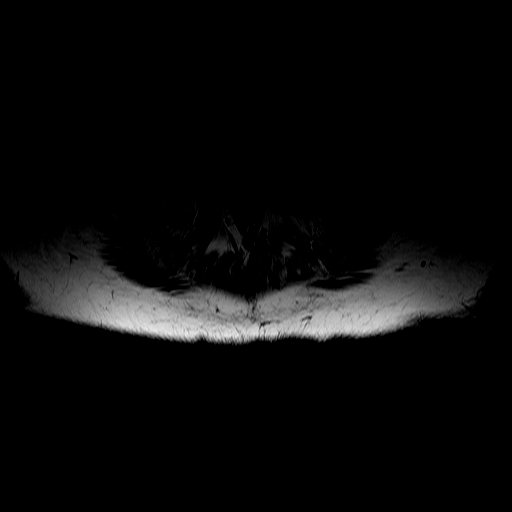

[27 of 48 positions shown; findings below may reference images not displayed]

FINDINGS: The sacrum appears normal as does the coccyx. Sacroiliac joints are
normal. No mass lesions or fractures. There are degenerative and
postsurgical changes in the lower lumbar spine with previous
hemilaminectomy at L4-5 on the left.

There is no adenopathy or mass lesion. Uterus and adnexa appear
normal.
IMPRESSION: Normal MRI of the sacrum.

## 2019-06-08 ENCOUNTER — Ambulatory Visit: Payer: Medicare Other | Attending: Anesthesiology | Admitting: Anesthesiology

## 2019-06-08 ENCOUNTER — Other Ambulatory Visit: Payer: Self-pay

## 2019-06-08 DIAGNOSIS — F1190 Opioid use, unspecified, uncomplicated: Secondary | ICD-10-CM | POA: Diagnosis not present

## 2019-06-08 DIAGNOSIS — G894 Chronic pain syndrome: Secondary | ICD-10-CM

## 2019-06-08 DIAGNOSIS — R5382 Chronic fatigue, unspecified: Secondary | ICD-10-CM

## 2019-06-08 DIAGNOSIS — M5136 Other intervertebral disc degeneration, lumbar region: Secondary | ICD-10-CM

## 2019-06-08 DIAGNOSIS — M961 Postlaminectomy syndrome, not elsewhere classified: Secondary | ICD-10-CM

## 2019-06-08 DIAGNOSIS — M5386 Other specified dorsopathies, lumbar region: Secondary | ICD-10-CM

## 2019-06-08 DIAGNOSIS — M47816 Spondylosis without myelopathy or radiculopathy, lumbar region: Secondary | ICD-10-CM

## 2019-06-08 DIAGNOSIS — M51369 Other intervertebral disc degeneration, lumbar region without mention of lumbar back pain or lower extremity pain: Secondary | ICD-10-CM

## 2019-06-08 DIAGNOSIS — M4120 Other idiopathic scoliosis, site unspecified: Secondary | ICD-10-CM

## 2019-06-08 MED ORDER — OXYCODONE HCL 10 MG PO TABS: 10.0000 mg | ORAL_TABLET | Freq: Two times a day (BID) | ORAL | 0 refills | Status: DC

## 2019-06-09 ENCOUNTER — Encounter: Payer: Self-pay | Admitting: Anesthesiology

## 2019-06-09 MED ORDER — OXYCODONE HCL 10 MG PO TABS: 10.0000 mg | ORAL_TABLET | Freq: Two times a day (BID) | ORAL | 0 refills | Status: DC

## 2019-06-09 NOTE — Progress Notes (Signed)
Virtual Visit via Telephone Note  I connected with Whitney Rivera on 06/09/19 at 11:15 AM EDT by telephone and verified that I am speaking with the correct person using two identifiers.  Location: Patient: Home Provider: Pain control center   I discussed the limitations, risks, security and privacy concerns of performing an evaluation and management service by telephone and the availability of in person appointments. I also discussed with the patient that there may be a patient responsible charge related to this service. The patient expressed understanding and agreed to proceed.   History of Present Illness: I was able to catch up with Whitney Rivera regarding her low back pain and diffuse pain.  No changes are reported in the quality characteristic and distribution of her pain and she is taking her medications as prescribed twice a day on the oxycodone.  This continues to work well for her.  She has been more active as well try to get out more often with the Covid crisis and this is helping to abate some of her pain.  She mentions that she is continuing to derive good functional lifestyle improvement with the medicines and no untoward side effects are noted.  The medication taken twice a day seems to be working better as she was having more breakthrough during her last discussion.  Otherwise she is in her usual state of health this time    Observations/Objective: Current Outpatient Medications:  .  aspirin EC 81 MG tablet, Take 81 mg by mouth daily., Disp: , Rfl:  .  cyclobenzaprine (FLEXERIL) 5 MG tablet, Take 1 tablet by mouth daily., Disp: , Rfl:  .  HYDROcodone-acetaminophen (NORCO) 7.5-325 MG tablet, Take 1 tablet by mouth 3 (three) times daily., Disp: 75 tablet, Rfl: 0 .  lisinopril (PRINIVIL,ZESTRIL) 2.5 MG tablet, Take 2.5 mg by mouth daily., Disp: , Rfl:  .  metoprolol tartrate (LOPRESSOR) 50 MG tablet, Take 50 mg by mouth daily., Disp: , Rfl:  .  [START ON 07/10/2019] Oxycodone HCl 10 MG  TABS, Take 1 tablet (10 mg total) by mouth 2 (two) times daily., Disp: 60 tablet, Rfl: 0   Assessment and Plan: 1. Chronic fatigue   2. Chronic pain syndrome   3. Chronic, continuous use of opioids   4. DDD (degenerative disc disease), lumbar   5. Facet arthropathy, lumbar   6. Low back derangement syndrome   7. Scoliosis (and kyphoscoliosis), idiopathic   8. Lumbar post-laminectomy syndrome   Based on our discussion today and upon review of the [place] practitioner database information going to refill her medications for the next 2 months dated for October and November.  We will schedule her for return to clinic in 2 months.  She is in instructed to follow-up with her primary care physicians for her baseline medical care and to contact us at the pain control center should she have any problems in the meantime.   Follow Up Instructions:    I discussed the assessment and treatment plan with the patient. The patient was provided an opportunity to ask questions and all were answered. The patient agreed with the plan and demonstrated an understanding of the instructions.   The patient was advised to call back or seek an in-person evaluation if the symptoms worsen or if the condition fails to improve as anticipated.  I provided 30 minutes of non-face-to-face time during this encounter.   Molli Barrows, MD Home

## 2019-08-08 ENCOUNTER — Encounter: Payer: Self-pay | Admitting: Anesthesiology

## 2019-08-08 ENCOUNTER — Ambulatory Visit: Payer: Medicare Other | Attending: Anesthesiology | Admitting: Anesthesiology

## 2019-08-08 ENCOUNTER — Other Ambulatory Visit: Payer: Self-pay

## 2019-08-08 DIAGNOSIS — G894 Chronic pain syndrome: Secondary | ICD-10-CM

## 2019-08-08 DIAGNOSIS — R5382 Chronic fatigue, unspecified: Secondary | ICD-10-CM

## 2019-08-08 DIAGNOSIS — M47816 Spondylosis without myelopathy or radiculopathy, lumbar region: Secondary | ICD-10-CM | POA: Diagnosis not present

## 2019-08-08 DIAGNOSIS — M961 Postlaminectomy syndrome, not elsewhere classified: Secondary | ICD-10-CM

## 2019-08-08 DIAGNOSIS — M4120 Other idiopathic scoliosis, site unspecified: Secondary | ICD-10-CM

## 2019-08-08 DIAGNOSIS — M5386 Other specified dorsopathies, lumbar region: Secondary | ICD-10-CM

## 2019-08-08 DIAGNOSIS — M5136 Other intervertebral disc degeneration, lumbar region: Secondary | ICD-10-CM

## 2019-08-08 DIAGNOSIS — F1190 Opioid use, unspecified, uncomplicated: Secondary | ICD-10-CM

## 2019-08-08 MED ORDER — OXYCODONE HCL 10 MG PO TABS: 10.0000 mg | ORAL_TABLET | Freq: Two times a day (BID) | ORAL | 0 refills | Status: DC

## 2019-08-08 NOTE — Progress Notes (Signed)
Virtual Visit via Telephone Note  I connected with Whitney Rivera on 08/08/19 at  1:00 PM EST by telephone and verified that I am speaking with the correct person using two identifiers.  Location: Patient: Home Provider: Pain control center   I discussed the limitations, risks, security and privacy concerns of performing an evaluation and management service by telephone and the availability of in person appointments. I also discussed with the patient that there may be a patient responsible charge related to this service. The patient expressed understanding and agreed to proceed.   History of Present Illness: I spoke with [name] today via telephone for her virtual conference return.  She was unable to do the video portion of the conferencing.  She states that she is doing well in regards to her low back pain.  The quality has remained stable.  No significant changes are noted in the location duration or severity.  The characteristics has been consistent and she is taking her medications with good relief.  She continues to try and do some stretching strengthening exercises with limited relief but the medications continue to work well for her.  No side effects are reported.  Otherwise she is in her usual state of health no new changes in bowel or bladder function or lower extremity strength or function.  She is trying to be active and walk routinely despite the Covid crisis which she feels is helping her.   Observations/Objective:  Current Outpatient Medications:  .  aspirin EC 81 MG tablet, Take 81 mg by mouth daily., Disp: , Rfl:  .  cyclobenzaprine (FLEXERIL) 5 MG tablet, Take 1 tablet by mouth daily., Disp: , Rfl:  .  lisinopril (PRINIVIL,ZESTRIL) 2.5 MG tablet, Take 2.5 mg by mouth daily., Disp: , Rfl:  .  metoprolol tartrate (LOPRESSOR) 50 MG tablet, Take 50 mg by mouth daily., Disp: , Rfl:  .  Oxycodone HCl 10 MG TABS, Take 1 tablet (10 mg total) by mouth 2 (two) times daily., Disp: 60  tablet, Rfl: 0 .  [START ON 09/07/2019] Oxycodone HCl 10 MG TABS, Take 1 tablet (10 mg total) by mouth 2 (two) times daily., Disp: 60 tablet, Rfl: 0  Assessment and Plan: 1. Chronic fatigue   2. Chronic pain syndrome   3. Chronic, continuous use of opioids   4. DDD (degenerative disc disease), lumbar   5. Facet arthropathy, lumbar   6. Low back derangement syndrome   7. Scoliosis (and kyphoscoliosis), idiopathic   8. Lumbar post-laminectomy syndrome   Based on our discussion today and upon review of the [facility] practitioner database information when to refill her medications dated for December 15 and January 14.  We will continue with her existing regimen.  I want her to continue follow-up with her primary care physicians for baseline medical care.  We will schedule her for return to clinic in 2 months and I want her to continue with her stretching strengthening exercises and walking.  Follow Up Instructions:    I discussed the assessment and treatment plan with the patient. The patient was provided an opportunity to ask questions and all were answered. The patient agreed with the plan and demonstrated an understanding of the instructions.   The patient was advised to call back or seek an in-person evaluation if the symptoms worsen or if the condition fails to improve as anticipated.  I provided 30 minutes of non-face-to-face time during this encounter.   Molli Barrows, MD

## 2019-10-02 ENCOUNTER — Encounter: Payer: Self-pay | Admitting: Anesthesiology

## 2019-10-02 ENCOUNTER — Ambulatory Visit: Payer: Medicare Other | Attending: Anesthesiology | Admitting: Anesthesiology

## 2019-10-02 ENCOUNTER — Other Ambulatory Visit: Payer: Self-pay

## 2019-10-02 DIAGNOSIS — F1190 Opioid use, unspecified, uncomplicated: Secondary | ICD-10-CM

## 2019-10-02 DIAGNOSIS — M5136 Other intervertebral disc degeneration, lumbar region: Secondary | ICD-10-CM

## 2019-10-02 DIAGNOSIS — M961 Postlaminectomy syndrome, not elsewhere classified: Secondary | ICD-10-CM

## 2019-10-02 DIAGNOSIS — R5382 Chronic fatigue, unspecified: Secondary | ICD-10-CM | POA: Diagnosis not present

## 2019-10-02 DIAGNOSIS — M47816 Spondylosis without myelopathy or radiculopathy, lumbar region: Secondary | ICD-10-CM

## 2019-10-02 DIAGNOSIS — M5386 Other specified dorsopathies, lumbar region: Secondary | ICD-10-CM

## 2019-10-02 DIAGNOSIS — G894 Chronic pain syndrome: Secondary | ICD-10-CM

## 2019-10-02 DIAGNOSIS — M4120 Other idiopathic scoliosis, site unspecified: Secondary | ICD-10-CM

## 2019-10-02 MED ORDER — OXYCODONE HCL 10 MG PO TABS: 10.0000 mg | ORAL_TABLET | Freq: Two times a day (BID) | ORAL | 0 refills | Status: DC

## 2019-10-02 NOTE — Progress Notes (Signed)
Virtual Visit via Telephone Note  I connected with Whitney Rivera on 10/02/19 at  2:15 PM EST by telephone and verified that I am speaking with the correct person using two identifiers.  Location: Patient: Home Provider: Pain control center   I discussed the limitations, risks, security and privacy concerns of performing an evaluation and management service by telephone and the availability of in person appointments. I also discussed with the patient that there may be a patient responsible charge related to this service. The patient expressed understanding and agreed to proceed.   History of Present Illness: I spoke with [name] today regarding her low back pain and persistent lower extremity pain.  These been stable in nature and her symptom complex appears to be stable as well.  She is taking her medications approximately twice a day but sometimes she is able hold out for 2 to 3 days at a time.  The medication is well-tolerated though occasionally it causes her to feel a little loopy, she reports.  She does get good relief in regards to pain management and no other side effects are reported.  The quality characteristic distribution of her pain have been stable otherwise with no changes noted.  She is taking specialized diet to assist with an anti-inflammatory approach.  Her bowel bladder function has been stable and otherwise has been doing well.    Observations/Objective:  Current Outpatient Medications:  .  aspirin EC 81 MG tablet, Take 81 mg by mouth daily., Disp: , Rfl:  .  cyclobenzaprine (FLEXERIL) 5 MG tablet, Take 1 tablet by mouth daily., Disp: , Rfl:  .  lisinopril (PRINIVIL,ZESTRIL) 2.5 MG tablet, Take 2.5 mg by mouth daily., Disp: , Rfl:  .  metoprolol tartrate (LOPRESSOR) 50 MG tablet, Take 50 mg by mouth daily., Disp: , Rfl:  .  [START ON 10/09/2019] Oxycodone HCl 10 MG TABS, Take 1 tablet (10 mg total) by mouth 2 (two) times daily., Disp: 60 tablet, Rfl: 0 .  [START ON  11/07/2019] Oxycodone HCl 10 MG TABS, Take 1 tablet (10 mg total) by mouth 2 (two) times daily., Disp: 60 tablet, Rfl: 0  Assessment and Plan: 1. Chronic fatigue   2. Chronic pain syndrome   3. Chronic, continuous use of opioids   4. DDD (degenerative disc disease), lumbar   5. Facet arthropathy, lumbar   6. Low back derangement syndrome   7. Lumbar post-laminectomy syndrome   8. Scoliosis (and kyphoscoliosis), idiopathic   Based on our discussion today I am going to refill her medications for the next 2 months February 15 of March 16.  We have talked about reducing her dosage to a 5 mg tablet or 7.5 mg tablet instead of the 10.  She does not want to make this change at this point.  If she desires to make that modification we will assist.  I want her to continue with her current regimen with stretching strengthening exercises with return to clinic in 2 months for follow-up.  She is also to continue with her primary care physicians for her baseline medical care.  Follow Up Instructions:    I discussed the assessment and treatment plan with the patient. The patient was provided an opportunity to ask questions and all were answered. The patient agreed with the plan and demonstrated an understanding of the instructions.   The patient was advised to call back or seek an in-person evaluation if the symptoms worsen or if the condition fails to improve as anticipated.  I provided 30 minutes of non-face-to-face time during this encounter.   Molli Barrows, MD

## 2019-12-11 ENCOUNTER — Encounter: Payer: Self-pay | Admitting: Anesthesiology

## 2019-12-11 ENCOUNTER — Ambulatory Visit (HOSPITAL_BASED_OUTPATIENT_CLINIC_OR_DEPARTMENT_OTHER): Payer: Medicare Other | Admitting: Anesthesiology

## 2019-12-11 ENCOUNTER — Other Ambulatory Visit: Payer: Self-pay

## 2019-12-11 DIAGNOSIS — F1190 Opioid use, unspecified, uncomplicated: Secondary | ICD-10-CM | POA: Diagnosis not present

## 2019-12-11 DIAGNOSIS — R5382 Chronic fatigue, unspecified: Secondary | ICD-10-CM | POA: Diagnosis not present

## 2019-12-11 DIAGNOSIS — M4120 Other idiopathic scoliosis, site unspecified: Secondary | ICD-10-CM

## 2019-12-11 DIAGNOSIS — M47817 Spondylosis without myelopathy or radiculopathy, lumbosacral region: Secondary | ICD-10-CM

## 2019-12-11 DIAGNOSIS — M961 Postlaminectomy syndrome, not elsewhere classified: Secondary | ICD-10-CM

## 2019-12-11 DIAGNOSIS — G894 Chronic pain syndrome: Secondary | ICD-10-CM

## 2019-12-11 DIAGNOSIS — M5386 Other specified dorsopathies, lumbar region: Secondary | ICD-10-CM

## 2019-12-11 DIAGNOSIS — M5136 Other intervertebral disc degeneration, lumbar region: Secondary | ICD-10-CM

## 2019-12-11 DIAGNOSIS — M47816 Spondylosis without myelopathy or radiculopathy, lumbar region: Secondary | ICD-10-CM | POA: Diagnosis not present

## 2019-12-11 MED ORDER — OXYCODONE HCL 10 MG PO TABS: 10.0000 mg | ORAL_TABLET | Freq: Two times a day (BID) | ORAL | 0 refills | Status: DC

## 2019-12-12 ENCOUNTER — Telehealth: Payer: Medicare Other | Admitting: Anesthesiology

## 2019-12-13 NOTE — Progress Notes (Signed)
Virtual Visit via Telephone Note  I connected with [name] on 12/13/19 at  1:15 PM EDT by telephone and verified that I am speaking with the correct person using two identifiers.  Location: Patient: Home Provider: Pain control center   I discussed the limitations, risks, security and privacy concerns of performing an evaluation and management service by telephone and the availability of in person appointments. I also discussed with the patient that there may be a patient responsible charge related to this service. The patient expressed understanding and agreed to proceed.   History of Present Illness: I spoke with [name] today via telephone as she was unable to do the video portion of the virtual conference.  She reports that her pain has been reasonably well controlled with her current regimen.  She is taking her medications as prescribed and this works well for her.  Keeps her pain under decent control and the quality characteristic and distribution of pain have otherwise been stable.  She is trying to be active with ambulation and stretching exercises as best tolerated.  Her bowel bladder function been stable with no problems with the medications reported today.  She continues to derive good functional lifestyle improvement with the medicine she reports.   Observations/Objective:  Current Outpatient Medications:  .  aspirin EC 81 MG tablet, Take 81 mg by mouth daily., Disp: , Rfl:  .  cyclobenzaprine (FLEXERIL) 5 MG tablet, Take 1 tablet by mouth daily., Disp: , Rfl:  .  lisinopril (PRINIVIL,ZESTRIL) 2.5 MG tablet, Take 2.5 mg by mouth daily., Disp: , Rfl:  .  metoprolol tartrate (LOPRESSOR) 50 MG tablet, Take 50 mg by mouth daily., Disp: , Rfl:  .  Oxycodone HCl 10 MG TABS, Take 1 tablet (10 mg total) by mouth 2 (two) times daily., Disp: 60 tablet, Rfl: 0 .  [START ON 01/10/2020] Oxycodone HCl 10 MG TABS, Take 1 tablet (10 mg total) by mouth 2 (two) times daily., Disp: 60 tablet,  Rfl: 0  Assessment and Plan:  1. Chronic fatigue   2. Chronic pain syndrome   3. Chronic, continuous use of opioids   4. DDD (degenerative disc disease), lumbar   5. Facet arthropathy, lumbar   6. Low back derangement syndrome   7. Lumbar post-laminectomy syndrome   8. Scoliosis (and kyphoscoliosis), idiopathic   9. Facet arthritis of lumbosacral region   Based on our discussion today upon review of the [facility] practitioner database information it is appropriate to go ahead and continue her on her current regimen.  These will be dated for April 19 and May 19.  She will be scheduled for 58-month return to clinic.  In the meantime I want her to continue with her current exercise protocol and continue follow-up with her primary care physicians for baseline medical care Follow Up Instructions:    I discussed the assessment and treatment plan with the patient. The patient was provided an opportunity to ask questions and all were answered. The patient agreed with the plan and demonstrated an understanding of the instructions.   The patient was advised to call back or seek an in-person evaluation if the symptoms worsen or if the condition fails to improve as anticipated.  I provided 25 minutes of non-face-to-face time during this encounter.   [name], MD

## 2020-01-16 ENCOUNTER — Other Ambulatory Visit: Payer: Self-pay | Admitting: Internal Medicine

## 2020-02-20 ENCOUNTER — Other Ambulatory Visit: Payer: Self-pay

## 2020-02-20 ENCOUNTER — Ambulatory Visit: Payer: Medicare Other | Attending: Anesthesiology | Admitting: Anesthesiology

## 2020-02-20 ENCOUNTER — Encounter: Payer: Self-pay | Admitting: Anesthesiology

## 2020-02-20 VITALS — BP 139/68 | HR 68 | Temp 97.3°F | Ht 64.0 in | Wt 128.0 lb

## 2020-02-20 DIAGNOSIS — M961 Postlaminectomy syndrome, not elsewhere classified: Secondary | ICD-10-CM | POA: Diagnosis not present

## 2020-02-20 DIAGNOSIS — F1190 Opioid use, unspecified, uncomplicated: Secondary | ICD-10-CM | POA: Diagnosis not present

## 2020-02-20 DIAGNOSIS — M5386 Other specified dorsopathies, lumbar region: Secondary | ICD-10-CM

## 2020-02-20 DIAGNOSIS — M47816 Spondylosis without myelopathy or radiculopathy, lumbar region: Secondary | ICD-10-CM | POA: Diagnosis not present

## 2020-02-20 DIAGNOSIS — M47817 Spondylosis without myelopathy or radiculopathy, lumbosacral region: Secondary | ICD-10-CM

## 2020-02-20 DIAGNOSIS — M5136 Other intervertebral disc degeneration, lumbar region: Secondary | ICD-10-CM | POA: Diagnosis not present

## 2020-02-20 DIAGNOSIS — G894 Chronic pain syndrome: Secondary | ICD-10-CM | POA: Diagnosis not present

## 2020-02-20 DIAGNOSIS — M4120 Other idiopathic scoliosis, site unspecified: Secondary | ICD-10-CM

## 2020-02-20 MED ORDER — OXYCODONE HCL 10 MG PO TABS: 10.0000 mg | ORAL_TABLET | Freq: Two times a day (BID) | ORAL | 0 refills | Status: DC

## 2020-02-20 NOTE — Progress Notes (Signed)
Nursing Pain Medication Assessment:  Safety precautions to be maintained throughout the outpatient stay will include: orient to surroundings, keep bed in low position, maintain call bell within reach at all times, provide assistance with transfer out of bed and ambulation.  Medication Inspection Compliance: Pill count conducted under aseptic conditions, in front of the patient. Neither the pills nor the bottle was removed from the patient's sight at any time. Once count was completed pills were immediately returned to the patient in their original bottle.  Medication: Oxycodone IR Pill/Patch Count: 33 of 60 pills remain Pill/Patch Appearance: Markings consistent with prescribed medication Bottle Appearance: Standard pharmacy container. Clearly labeled. Filled Date: 5 / 19 / 2021 Last Medication intake:  TodaySafety precautions to be maintained throughout the outpatient stay will include: orient to surroundings, keep bed in low position, maintain call bell within reach at all times, provide assistance with transfer out of bed and ambulation.

## 2020-02-23 NOTE — Progress Notes (Signed)
Subjective:  Patient ID: Whitney Rivera, female    DOB: 1961/02/23  Age: 59 y.o. MRN: [id number]  CC: Back Pain   Procedure: None  HPI Whitney Rivera presents for reevaluation.  She was last seen via virtual conference and presents today for reevaluation.  She states that she has been doing quite well with her current pain regimen.  She continues to derive good functional benefit from the medications and is sleeping reasonably well at night.  She is staying active and trying to do her physical therapy as best tolerated.  Otherwise she is in her usual state of health.  No changes in the symptom quality characteristic or distribution of her low back and leg pain are noted.  Her strength is been well preserved she is staying active as best she can and bowel and bladder function have been stable.  No side effects are reported with her medication management either.  Outpatient Medications Prior to Visit  Medication Sig Dispense Refill   aspirin EC 81 MG tablet Take 81 mg by mouth daily.     cyclobenzaprine (FLEXERIL) 5 MG tablet Take 1 tablet by mouth daily.     lisinopril (PRINIVIL,ZESTRIL) 2.5 MG tablet Take 2.5 mg by mouth daily.     metoprolol tartrate (LOPRESSOR) 25 MG tablet TAKE 1 TABLET BY MOUTH TWICE (2) DAILY 60 tablet 3   Multiple Vitamins-Minerals (MULTIVITAMIN WITH MINERALS) tablet Take 1 tablet by mouth daily.     metoprolol tartrate (LOPRESSOR) 50 MG tablet Take 50 mg by mouth daily. (Patient not taking: Reported on 02/20/2020)     Oxycodone HCl 10 MG TABS Take 1 tablet (10 mg total) by mouth 2 (two) times daily. 60 tablet 0   Oxycodone HCl 10 MG TABS Take 1 tablet (10 mg total) by mouth 2 (two) times daily. 60 tablet 0   No facility-administered medications prior to visit.    Review of Systems CNS: No confusion or sedation Cardiac: No angina or palpitations GI: No abdominal pain or constipation Constitutional: No nausea vomiting fevers or chills  Objective:  BP 139/68     Pulse 68    Temp (!) 97.3 F (36.3 C)    Ht 5\' 4"  (1.626 m)    Wt 128 lb (58.1 kg)    SpO2 97%    BMI 21.97 kg/m    BP Readings from Last 3 Encounters:  02/20/20 139/68  10/28/17 108/72  01/24/16 (!) 141/92     Wt Readings from Last 3 Encounters:  02/20/20 128 lb (58.1 kg)  10/28/17 124 lb (56.2 kg)  01/24/16 120 lb (54.4 kg)     Physical Exam Pt is alert and oriented PERRL EOMI HEART IS RRR no murmur or rub LCTA no wheezing or rales MUSCULOSKELETAL reveals some mild paraspinous muscle tenderness but no overt trigger points.  She goes from seated to standing without much difficulty.  She has a slightly antalgic gait but is ambulating reasonably well.  Her muscle tone and bulk are at baseline.  Labs  No results found for: HGBA1C Lab Results  Component Value Date   CREATININE 0.77 05/27/2015    -------------------------------------------------------------------------------------------------------------------- Lab Results  Component Value Date   WBC 8.6 05/27/2015   HGB 14.0 05/27/2015   HCT 42.2 05/27/2015   PLT 211 05/27/2015   GLUCOSE 117 (H) 05/27/2015   ALT 11 (L) 05/27/2015   AST 20 05/27/2015   NA 136 05/27/2015   K 3.8 03/18/2017   CL 106 05/27/2015   CREATININE 0.77 05/27/2015  BUN 9 05/27/2015   CO2 25 05/27/2015    --------------------------------------------------------------------------------------------------------------------- No results found.   Assessment & Plan:   Whitney Rivera was seen today for back pain.  Diagnoses and all orders for this visit:  Chronic pain syndrome -     ToxASSURE Select 13 (MW), Urine  Chronic, continuous use of opioids -     ToxASSURE Select 13 (MW), Urine  DDD (degenerative disc disease), lumbar  Facet arthropathy, lumbar  Low back derangement syndrome  Lumbar post-laminectomy syndrome  Scoliosis (and kyphoscoliosis), idiopathic  Facet arthritis of lumbosacral region  Other orders -     Oxycodone HCl  10 MG TABS; Take 1 tablet (10 mg total) by mouth 2 (two) times daily. -     Oxycodone HCl 10 MG TABS; Take 1 tablet (10 mg total) by mouth 2 (two) times daily.        ----------------------------------------------------------------------------------------------------------------------  Problem List Items Addressed This Visit      Unprioritized   Chronic pain syndrome - Primary   Relevant Medications   Oxycodone HCl 10 MG TABS   Oxycodone HCl 10 MG TABS (Start on 03/21/2020)   Other Relevant Orders   ToxASSURE Select 13 (MW), Urine (Completed)   Chronic, continuous use of opioids   Relevant Orders   ToxASSURE Select 13 (MW), Urine (Completed)   DDD (degenerative disc disease), lumbar   Relevant Medications   Oxycodone HCl 10 MG TABS   Oxycodone HCl 10 MG TABS (Start on 03/21/2020)   Facet arthropathy, lumbar   Low back derangement syndrome   Relevant Medications   Oxycodone HCl 10 MG TABS   Oxycodone HCl 10 MG TABS (Start on 03/21/2020)   Lumbar post-laminectomy syndrome    Other Visit Diagnoses    Scoliosis (and kyphoscoliosis), idiopathic       Facet arthritis of lumbosacral region       Relevant Medications   Oxycodone HCl 10 MG TABS   Oxycodone HCl 10 MG TABS (Start on 03/21/2020)        ----------------------------------------------------------------------------------------------------------------------  1. Chronic pain syndrome We will continue her current pain management regimen.  I have reviewed the [facility] practitioner database information and it is appropriate for refills dated for June 29 and July 29.  We will schedule her for to return to clinic in 2 months.  A routine urine drug screen will be requested per protocol. - ToxASSURE Select 13 (MW), Urine  2. Chronic, continuous use of opioids As above - ToxASSURE Select 13 (MW), Urine  3. DDD (degenerative disc disease), lumbar Continue with core stretching strengthening exercises once again reviewed  today.  4. Facet arthropathy, lumbar   5. Low back derangement syndrome   6. Lumbar post-laminectomy syndrome   7. Scoliosis (and kyphoscoliosis), idiopathic   8. Facet arthritis of lumbosacral region     ----------------------------------------------------------------------------------------------------------------------  I am having Whitney Rivera maintain her aspirin EC, cyclobenzaprine, metoprolol tartrate, lisinopril, metoprolol tartrate, Oxycodone HCl, Oxycodone HCl, and multivitamin with minerals.   Meds ordered this encounter  Medications   Oxycodone HCl 10 MG TABS    Sig: Take 1 tablet (10 mg total) by mouth 2 (two) times daily.    Dispense:  60 tablet    Refill:  0   Oxycodone HCl 10 MG TABS    Sig: Take 1 tablet (10 mg total) by mouth 2 (two) times daily.    Dispense:  60 tablet    Refill:  0   Patient's Medications  New Prescriptions   No medications  on file  Previous Medications   ASPIRIN EC 81 MG TABLET    Take 81 mg by mouth daily.   CYCLOBENZAPRINE (FLEXERIL) 5 MG TABLET    Take 1 tablet by mouth daily.   LISINOPRIL (PRINIVIL,ZESTRIL) 2.5 MG TABLET    Take 2.5 mg by mouth daily.   METOPROLOL TARTRATE (LOPRESSOR) 25 MG TABLET    TAKE 1 TABLET BY MOUTH TWICE (2) DAILY   METOPROLOL TARTRATE (LOPRESSOR) 50 MG TABLET    Take 50 mg by mouth daily.   MULTIPLE VITAMINS-MINERALS (MULTIVITAMIN WITH MINERALS) TABLET    Take 1 tablet by mouth daily.  Modified Medications   Modified Medication Previous Medication   OXYCODONE HCL 10 MG TABS Oxycodone HCl 10 MG TABS      Take 1 tablet (10 mg total) by mouth 2 (two) times daily.    Take 1 tablet (10 mg total) by mouth 2 (two) times daily.   OXYCODONE HCL 10 MG TABS Oxycodone HCl 10 MG TABS      Take 1 tablet (10 mg total) by mouth 2 (two) times daily.    Take 1 tablet (10 mg total) by mouth 2 (two) times daily.  Discontinued Medications   No medications on file    ----------------------------------------------------------------------------------------------------------------------  Follow-up: Return in about 1 month (around 03/21/2020) for evaluation, med refill.    [name], MD

## 2020-02-26 LAB — TOXASSURE SELECT 13 (MW), URINE

## 2020-03-21 ENCOUNTER — Other Ambulatory Visit: Payer: Self-pay

## 2020-03-21 ENCOUNTER — Encounter: Payer: Self-pay | Admitting: Anesthesiology

## 2020-03-21 ENCOUNTER — Ambulatory Visit: Payer: Medicare Other | Attending: Anesthesiology | Admitting: Anesthesiology

## 2020-03-21 DIAGNOSIS — F1190 Opioid use, unspecified, uncomplicated: Secondary | ICD-10-CM

## 2020-03-21 DIAGNOSIS — G894 Chronic pain syndrome: Secondary | ICD-10-CM

## 2020-03-21 DIAGNOSIS — M5386 Other specified dorsopathies, lumbar region: Secondary | ICD-10-CM

## 2020-03-21 DIAGNOSIS — M47816 Spondylosis without myelopathy or radiculopathy, lumbar region: Secondary | ICD-10-CM

## 2020-03-21 DIAGNOSIS — M5136 Other intervertebral disc degeneration, lumbar region: Secondary | ICD-10-CM

## 2020-03-21 DIAGNOSIS — M4120 Other idiopathic scoliosis, site unspecified: Secondary | ICD-10-CM

## 2020-03-21 DIAGNOSIS — M961 Postlaminectomy syndrome, not elsewhere classified: Secondary | ICD-10-CM

## 2020-03-21 MED ORDER — METHOCARBAMOL 500 MG PO TABS: 500.0000 mg | ORAL_TABLET | Freq: Three times a day (TID) | ORAL | 2 refills | Status: AC | PRN

## 2020-03-21 MED ORDER — OXYCODONE HCL 10 MG PO TABS: 10.0000 mg | ORAL_TABLET | Freq: Two times a day (BID) | ORAL | 0 refills | Status: DC

## 2020-03-21 NOTE — Progress Notes (Signed)
Virtual Visit via Telephone Note  I connected with Whitney Rivera on 03/21/20 at  1:15 PM EDT by telephone and verified that I am speaking with the correct person using two identifiers.  Location: Patient: Home Provider: Pain control center   I discussed the limitations, risks, security and privacy concerns of performing an evaluation and management service by telephone and the availability of in person appointments. I also discussed with the patient that there may be a patient responsible charge related to this service. The patient expressed understanding and agreed to proceed.   History of Present Illness: I spoke to Whitney Rivera via telephone today as she was unable to do the video portion of the virtual conference but she reports that she is doing reasonably well.  She has been taking her oxycodone 1/2 tablet of the 10 mg strength approximately 3-4 times a day on some days try to average 3/day.  She has been more active recently and is planning on moving and so she has been evaluating new homes as option and this is kept her more active and bending and kneeling more she reports.  This causes more of her low back pain.  The quality characteristic and distribution of the pain have been stable in nature.  She has had some problems with some low back spasming for which she has taken Flexeril but it makes her loopy.  Otherwise she is in her usual state of health with no new contributory changes noted today on our discussion.    Observations/Objective:  Current Outpatient Medications:  .  aspirin EC 81 MG tablet, Take 81 mg by mouth daily., Disp: , Rfl:  .  cyclobenzaprine (FLEXERIL) 5 MG tablet, Take 1 tablet by mouth daily., Disp: , Rfl:  .  lisinopril (PRINIVIL,ZESTRIL) 2.5 MG tablet, Take 2.5 mg by mouth daily., Disp: , Rfl:  .  metoprolol tartrate (LOPRESSOR) 25 MG tablet, TAKE 1 TABLET BY MOUTH TWICE (2) DAILY, Disp: 60 tablet, Rfl: 3 .  metoprolol tartrate (LOPRESSOR) 50 MG tablet, Take 50 mg by  mouth daily. (Patient not taking: Reported on 02/20/2020), Disp: , Rfl:  .  Multiple Vitamins-Minerals (MULTIVITAMIN WITH MINERALS) tablet, Take 1 tablet by mouth daily., Disp: , Rfl:  .  Oxycodone HCl 10 MG TABS, Take 1 tablet (10 mg total) by mouth 2 (two) times daily., Disp: 60 tablet, Rfl: 0 .  Oxycodone HCl 10 MG TABS, Take 1 tablet (10 mg total) by mouth 2 (two) times daily., Disp: 60 tablet, Rfl: 0  Assessment and Plan: 1. Chronic pain syndrome   2. Chronic, continuous use of opioids   3. DDD (degenerative disc disease), lumbar   4. Facet arthropathy, lumbar   5. Low back derangement syndrome   6. Lumbar post-laminectomy syndrome   7. Scoliosis (and kyphoscoliosis), idiopathic   Based on our evaluation today and review of the [facility] practitioner database information I am going to keep her medicines the same for the oxycodone.  She currently has a July 29 prescription pending with August 28 and September 27 given today.  These will be at a pharmacy.  I would have her stop her Flexeril and switch her over to Robaxin 500 mg tablets twice daily.  I told her she can start by cutting these in half to see how these are tolerated at the 250 mg dose.  Furthermore she is to continue with her stretching strengthening and if she has any changes or problems to contact us of the pain control center.  Continue  follow-up with her primary care physicians for baseline medical care.  Rescheduled for return to clinic in 2 months  Follow Up Instructions:    I discussed the assessment and treatment plan with the patient. The patient was provided an opportunity to ask questions and all were answered. The patient agreed with the plan and demonstrated an understanding of the instructions.   The patient was advised to call back or seek an in-person evaluation if the symptoms worsen or if the condition fails to improve as anticipated.  I provided 30 minutes of non-face-to-face time during this  encounter.   [name], MD

## 2020-05-21 ENCOUNTER — Encounter: Payer: Self-pay | Admitting: Anesthesiology

## 2020-05-21 ENCOUNTER — Ambulatory Visit: Payer: Medicare Other | Attending: Anesthesiology | Admitting: Anesthesiology

## 2020-05-21 ENCOUNTER — Other Ambulatory Visit: Payer: Self-pay

## 2020-05-21 DIAGNOSIS — G894 Chronic pain syndrome: Secondary | ICD-10-CM | POA: Diagnosis not present

## 2020-05-21 DIAGNOSIS — M5136 Other intervertebral disc degeneration, lumbar region: Secondary | ICD-10-CM

## 2020-05-21 DIAGNOSIS — R5382 Chronic fatigue, unspecified: Secondary | ICD-10-CM

## 2020-05-21 DIAGNOSIS — F1190 Opioid use, unspecified, uncomplicated: Secondary | ICD-10-CM

## 2020-05-21 DIAGNOSIS — M5386 Other specified dorsopathies, lumbar region: Secondary | ICD-10-CM | POA: Diagnosis not present

## 2020-05-21 DIAGNOSIS — M961 Postlaminectomy syndrome, not elsewhere classified: Secondary | ICD-10-CM

## 2020-05-21 DIAGNOSIS — M47817 Spondylosis without myelopathy or radiculopathy, lumbosacral region: Secondary | ICD-10-CM

## 2020-05-21 DIAGNOSIS — M47816 Spondylosis without myelopathy or radiculopathy, lumbar region: Secondary | ICD-10-CM

## 2020-05-21 DIAGNOSIS — M4120 Other idiopathic scoliosis, site unspecified: Secondary | ICD-10-CM

## 2020-05-21 MED ORDER — OXYCODONE HCL 10 MG PO TABS
10.0000 mg | ORAL_TABLET | Freq: Two times a day (BID) | ORAL | 0 refills | Status: DC
Start: 2020-06-19 — End: 2020-07-17

## 2020-05-23 NOTE — Progress Notes (Signed)
Virtual Visit via Telephone Note  I connected with Whitney Rivera on 05/23/20 at  1:45 PM EDT by telephone and verified that I am speaking with the correct person using two identifiers.  Location: Patient: Home Provider: Pain control [facility]   I discussed the limitations, risks, security and privacy concerns of performing an evaluation and management service by telephone and the availability of in person appointments. I also discussed with the patient that there may be a patient responsible charge related to this service. The patient expressed understanding and agreed to proceed.   History of Present Illness: I spoke with Whitney Rivera via telephone as she was unable to do the video portion of the virtual conference but she reports that she is doing reasonably well with her low back pain and her current medication regimen.  No other changes in the quality characteristic or distribution of her low back pain and leg pain have been noted.  She is taking her medications as prescribed and these continue to give her good relief.  No side effects are reported.  She is able to stay active as best she can with the current Covid crisis though she feels this has taken a toll.  She is trying to do her physical therapy exercises and attributes some of her back pain to persistent musculoskeletal problems.  Overall though she feels that she is doing much better on the opioid maintenance therapy and this is working well for her.    Observations/Objective:   Current Outpatient Medications:  .  aspirin EC 81 MG tablet, Take 81 mg by mouth daily., Disp: , Rfl:  .  cyclobenzaprine (FLEXERIL) 5 MG tablet, Take 1 tablet by mouth daily., Disp: , Rfl:  .  lisinopril (PRINIVIL,ZESTRIL) 2.5 MG tablet, Take 2.5 mg by mouth daily., Disp: , Rfl:  .  metoprolol tartrate (LOPRESSOR) 25 MG tablet, TAKE 1 TABLET BY MOUTH TWICE (2) DAILY, Disp: 60 tablet, Rfl: 3 .  metoprolol tartrate (LOPRESSOR) 50 MG tablet, Take 50 mg by mouth  daily. (Patient not taking: Reported on 02/20/2020), Disp: , Rfl:  .  Multiple Vitamins-Minerals (MULTIVITAMIN WITH MINERALS) tablet, Take 1 tablet by mouth daily., Disp: , Rfl:  .  Oxycodone HCl 10 MG TABS, Take 1 tablet (10 mg total) by mouth 2 (two) times daily., Disp: 60 tablet, Rfl: 0 .  [START ON 06/19/2020] Oxycodone HCl 10 MG TABS, Take 1 tablet (10 mg total) by mouth 2 (two) times daily., Disp: 60 tablet, Rfl: 0Assessment and Plan:   Follow Up Instructions: 1. Chronic pain syndrome   2. Chronic, continuous use of opioids   3. DDD (degenerative disc disease), lumbar   4. Facet arthropathy, lumbar   5. Low back derangement syndrome   6. Lumbar post-laminectomy syndrome   7. Scoliosis (and kyphoscoliosis), idiopathic   8. Facet arthritis of lumbosacral region   9. Chronic fatigue   Based on our discussion today and upon review of the [facility] practitioner database information it is appropriate to refill her medications and keep her on chronic opioid therapy for the next 2 months.  She has outstanding prescriptions for September 27 and October 27.  We will schedule her for 79-month return to clinic for routine follow-up.  I encouraged her to continue with stretching strengthening exercises and ambulation and daily walking as best tolerated.  She is also encouraged to follow-up with her primary care physicians for her baseline medical care.   I discussed the assessment and treatment plan with the patient. The  patient was provided an opportunity to ask questions and all were answered. The patient agreed with the plan and demonstrated an understanding of the instructions.   The patient was advised to call back or seek an in-person evaluation if the symptoms worsen or if the condition fails to improve as anticipated.  I provided 25 minutes of non-face-to-face time during this encounter.   [name], MD

## 2020-06-26 ENCOUNTER — Other Ambulatory Visit: Payer: Self-pay | Admitting: *Deleted

## 2020-06-26 MED ORDER — CYCLOBENZAPRINE HCL 5 MG PO TABS
5.0000 mg | ORAL_TABLET | Freq: Every day | ORAL | 6 refills | Status: DC
Start: 2020-06-26 — End: 2021-11-24

## 2020-07-15 ENCOUNTER — Ambulatory Visit: Payer: Medicare Other | Admitting: Internal Medicine

## 2020-07-17 ENCOUNTER — Other Ambulatory Visit: Payer: Self-pay

## 2020-07-17 ENCOUNTER — Encounter: Payer: Self-pay | Admitting: Anesthesiology

## 2020-07-17 ENCOUNTER — Ambulatory Visit: Payer: Medicare Other | Attending: Anesthesiology | Admitting: Anesthesiology

## 2020-07-17 DIAGNOSIS — F1190 Opioid use, unspecified, uncomplicated: Secondary | ICD-10-CM

## 2020-07-17 DIAGNOSIS — G894 Chronic pain syndrome: Secondary | ICD-10-CM | POA: Diagnosis not present

## 2020-07-17 DIAGNOSIS — F1120 Opioid dependence, uncomplicated: Secondary | ICD-10-CM

## 2020-07-17 DIAGNOSIS — M5386 Other specified dorsopathies, lumbar region: Secondary | ICD-10-CM

## 2020-07-17 DIAGNOSIS — M5136 Other intervertebral disc degeneration, lumbar region: Secondary | ICD-10-CM | POA: Diagnosis not present

## 2020-07-17 DIAGNOSIS — M47817 Spondylosis without myelopathy or radiculopathy, lumbosacral region: Secondary | ICD-10-CM

## 2020-07-17 DIAGNOSIS — M47816 Spondylosis without myelopathy or radiculopathy, lumbar region: Secondary | ICD-10-CM

## 2020-07-17 DIAGNOSIS — M961 Postlaminectomy syndrome, not elsewhere classified: Secondary | ICD-10-CM | POA: Diagnosis not present

## 2020-07-17 MED ORDER — OXYCODONE HCL 10 MG PO TABS
10.0000 mg | ORAL_TABLET | Freq: Two times a day (BID) | ORAL | 0 refills | Status: DC
Start: 2020-08-18 — End: 2020-09-18

## 2020-07-17 MED ORDER — OXYCODONE HCL 10 MG PO TABS
10.0000 mg | ORAL_TABLET | Freq: Two times a day (BID) | ORAL | 0 refills | Status: DC
Start: 2020-07-19 — End: 2020-09-18

## 2020-07-22 NOTE — Progress Notes (Signed)
Virtual Visit via Telephone Note  I connected with Whitney Rivera on 07/22/20 at  1:15 PM EST by telephone and verified that I am speaking with the correct person using two identifiers.  Location: Patient: Home Provider: Pain control center   I discussed the limitations, risks, security and privacy concerns of performing an evaluation and management service by telephone and the availability of in person appointments. I also discussed with the patient that there may be a patient responsible charge related to this service. The patient expressed understanding and agreed to proceed.   History of Present Illness: I spoke with Whitney Rivera via telephone as she was unable to do the video portion of virtual conference but she reports that her low back pain has been stable in nature.  She still taking her medications as prescribed generally averaging approximately 2 oxycodone tablets per day.  This continues to give her good relief.  She rates this approximately 75 to 80% improvement lasting about 6 to 8 hours before she gets recurrence of the same pain.  They enable her to be more active and she is sleeping better.  She denies any other troubles with the medication no side effects are reported.  The quality characteristic and distribution of her pain is otherwise been stable.  Her bowel bladder function is also been stable.    Observations/Objective:  Current Outpatient Medications:  .  aspirin EC 81 MG tablet, Take 81 mg by mouth daily., Disp: , Rfl:  .  cyclobenzaprine (FLEXERIL) 5 MG tablet, Take 1 tablet (5 mg total) by mouth daily., Disp: 30 tablet, Rfl: 6 .  lisinopril (PRINIVIL,ZESTRIL) 2.5 MG tablet, Take 2.5 mg by mouth daily., Disp: , Rfl:  .  metoprolol tartrate (LOPRESSOR) 25 MG tablet, TAKE 1 TABLET BY MOUTH TWICE (2) DAILY, Disp: 60 tablet, Rfl: 3 .  metoprolol tartrate (LOPRESSOR) 50 MG tablet, Take 50 mg by mouth daily. (Patient not taking: Reported on 02/20/2020), Disp: , Rfl:  .  Multiple  Vitamins-Minerals (MULTIVITAMIN WITH MINERALS) tablet, Take 1 tablet by mouth daily., Disp: , Rfl:  .  Oxycodone HCl 10 MG TABS, Take 1 tablet (10 mg total) by mouth 2 (two) times daily., Disp: 60 tablet, Rfl: 0 .  [START ON 08/18/2020] Oxycodone HCl 10 MG TABS, Take 1 tablet (10 mg total) by mouth 2 (two) times daily., Disp: 60 tablet, Rfl: 0  Assessment and Plan: 1. Chronic pain syndrome   2. Chronic, continuous use of opioids   3. DDD (degenerative disc disease), lumbar   4. Facet arthropathy, lumbar   5. Low back derangement syndrome   6. Lumbar post-laminectomy syndrome   7. Facet arthritis of lumbosacral region   Based on our discussion today and upon review of the [facility] practitioner database information I am going to refill her medications for November 26 and December 26.  No other changes will be initiated in her pain management protocol.  I encouraged her to continue with stretching strengthening exercises and continue follow-up with her primary care physicians for baseline medical care with return to clinic in 2 months  Follow Up Instructions:    I discussed the assessment and treatment plan with the patient. The patient was provided an opportunity to ask questions and all were answered. The patient agreed with the plan and demonstrated an understanding of the instructions.   The patient was advised to call back or seek an in-person evaluation if the symptoms worsen or if the condition fails to improve as anticipated.  I  provided 30 minutes of non-face-to-face time during this encounter.   [name], MD

## 2020-07-23 ENCOUNTER — Other Ambulatory Visit: Payer: Self-pay | Admitting: Anesthesiology

## 2020-07-31 ENCOUNTER — Other Ambulatory Visit: Payer: Self-pay | Admitting: Internal Medicine

## 2020-09-18 ENCOUNTER — Ambulatory Visit: Payer: Medicare Other | Attending: Anesthesiology | Admitting: Anesthesiology

## 2020-09-18 ENCOUNTER — Encounter: Payer: Self-pay | Admitting: Anesthesiology

## 2020-09-18 ENCOUNTER — Other Ambulatory Visit: Payer: Self-pay

## 2020-09-18 DIAGNOSIS — M5136 Other intervertebral disc degeneration, lumbar region: Secondary | ICD-10-CM | POA: Diagnosis not present

## 2020-09-18 DIAGNOSIS — M47816 Spondylosis without myelopathy or radiculopathy, lumbar region: Secondary | ICD-10-CM | POA: Diagnosis not present

## 2020-09-18 DIAGNOSIS — R5382 Chronic fatigue, unspecified: Secondary | ICD-10-CM

## 2020-09-18 DIAGNOSIS — F1190 Opioid use, unspecified, uncomplicated: Secondary | ICD-10-CM

## 2020-09-18 DIAGNOSIS — M5386 Other specified dorsopathies, lumbar region: Secondary | ICD-10-CM | POA: Diagnosis not present

## 2020-09-18 DIAGNOSIS — M961 Postlaminectomy syndrome, not elsewhere classified: Secondary | ICD-10-CM | POA: Diagnosis not present

## 2020-09-18 DIAGNOSIS — M47817 Spondylosis without myelopathy or radiculopathy, lumbosacral region: Secondary | ICD-10-CM

## 2020-09-18 DIAGNOSIS — G894 Chronic pain syndrome: Secondary | ICD-10-CM

## 2020-09-18 MED ORDER — OXYCODONE HCL 10 MG PO TABS
10.0000 mg | ORAL_TABLET | Freq: Two times a day (BID) | ORAL | 0 refills | Status: DC
Start: 2020-09-18 — End: 2020-11-11

## 2020-09-18 MED ORDER — OXYCODONE HCL 10 MG PO TABS
10.0000 mg | ORAL_TABLET | Freq: Two times a day (BID) | ORAL | 0 refills | Status: DC
Start: 2020-10-18 — End: 2020-11-11

## 2020-09-18 NOTE — Progress Notes (Signed)
Virtual Visit via Telephone Note  I connected with Whitney Rivera on 09/18/20 at  9:30 AM EST by telephone and verified that I am speaking with the correct person using two identifiers. -Sciatic stretch is 20 patient is a trial of biologic does not help I can see it now                 Location: Patient: Home Provider: Pain control center   I discussed the limitations, risks, security and privacy concerns of performing an evaluation and management service by telephone and the availability of in person appointments. I also discussed with the patient that there may be a patient responsible charge related to this service. The patient expressed understanding and agreed to proceed.   History of Present Illness: I spoke with Whitney Rivera via telephone as she was unable to do the video portion of the virtual conference but she reports that she is doing well with her current regimen.  No problems are noted.  The quality characteristic distribution of her's pain has been stable in nature.  She is doing her exercises as requested and making some decent progress with this.  She is denying any side effects with her current medication management and otherwise feels like she is doing well.   Observations/Objective:  Current Outpatient Medications:  .  aspirin EC 81 MG tablet, Take 81 mg by mouth daily., Disp: , Rfl:  .  cyclobenzaprine (FLEXERIL) 5 MG tablet, Take 1 tablet (5 mg total) by mouth daily., Disp: 30 tablet, Rfl: 6 .  lisinopril (PRINIVIL,ZESTRIL) 2.5 MG tablet, Take 2.5 mg by mouth daily., Disp: , Rfl:  .  metoprolol tartrate (LOPRESSOR) 25 MG tablet, TAKE 1 TABLET BY MOUTH TWICE (2) DAILY, Disp: 60 tablet, Rfl: 3 .  metoprolol tartrate (LOPRESSOR) 50 MG tablet, Take 50 mg by mouth daily. (Patient not taking: Reported on 02/20/2020), Disp: , Rfl:  .  Multiple Vitamins-Minerals (MULTIVITAMIN WITH MINERALS) tablet, Take 1 tablet by mouth daily., Disp: , Rfl:  .  Oxycodone HCl 10 MG TABS, Take 1 tablet (10  mg total) by mouth 2 (two) times daily., Disp: 60 tablet, Rfl: 0 .  [START ON 10/18/2020] Oxycodone HCl 10 MG TABS, Take 1 tablet (10 mg total) by mouth 2 (two) times daily., Disp: 60 tablet, Rfl: 0  Assessment and Plan: 1. Chronic pain syndrome   2. Chronic, continuous use of opioids   3. DDD (degenerative disc disease), lumbar   4. Facet arthropathy, lumbar   5. Low back derangement syndrome   6. Lumbar post-laminectomy syndrome   7. Facet arthritis of lumbosacral region   8. Chronic fatigue   Based on our discussion today I am going to continue her current pain management regimen with no changes initiated.  We will refill her medications today.  I encouraged her to continue with stretching strengthening exercises and feel that she is doing well and is currently stable.  She is encouraged to continue follow-up with her primary care physicians for baseline medical care.  Follow Up Instructions:    I discussed the assessment and treatment plan with the patient. The patient was provided an opportunity to ask questions and all were answered. The patient agreed with the plan and demonstrated an understanding of the instructions.   The patient was advised to call back or seek an in-person evaluation if the symptoms worsen or if the condition fails to improve as anticipated.  I provided 30 minutes of non-face-to-face time during this encounter.  I reviewed  my exam mammogram [name], MD Chief

## 2020-10-15 ENCOUNTER — Other Ambulatory Visit: Payer: Self-pay | Admitting: Internal Medicine

## 2020-10-15 ENCOUNTER — Other Ambulatory Visit: Payer: Self-pay | Admitting: Anesthesiology

## 2020-10-28 ENCOUNTER — Other Ambulatory Visit: Payer: Self-pay | Admitting: Anesthesiology

## 2020-11-11 ENCOUNTER — Ambulatory Visit: Payer: Medicare Other | Attending: Anesthesiology | Admitting: Anesthesiology

## 2020-11-11 ENCOUNTER — Encounter: Payer: Self-pay | Admitting: Anesthesiology

## 2020-11-11 ENCOUNTER — Other Ambulatory Visit: Payer: Self-pay

## 2020-11-11 DIAGNOSIS — F1190 Opioid use, unspecified, uncomplicated: Secondary | ICD-10-CM

## 2020-11-11 DIAGNOSIS — M47817 Spondylosis without myelopathy or radiculopathy, lumbosacral region: Secondary | ICD-10-CM | POA: Diagnosis not present

## 2020-11-11 DIAGNOSIS — M961 Postlaminectomy syndrome, not elsewhere classified: Secondary | ICD-10-CM | POA: Diagnosis not present

## 2020-11-11 DIAGNOSIS — M4186 Other forms of scoliosis, lumbar region: Secondary | ICD-10-CM | POA: Diagnosis not present

## 2020-11-11 DIAGNOSIS — G894 Chronic pain syndrome: Secondary | ICD-10-CM | POA: Diagnosis not present

## 2020-11-11 DIAGNOSIS — R5382 Chronic fatigue, unspecified: Secondary | ICD-10-CM

## 2020-11-11 DIAGNOSIS — M47816 Spondylosis without myelopathy or radiculopathy, lumbar region: Secondary | ICD-10-CM | POA: Diagnosis not present

## 2020-11-11 DIAGNOSIS — M5386 Other specified dorsopathies, lumbar region: Secondary | ICD-10-CM | POA: Diagnosis not present

## 2020-11-11 DIAGNOSIS — M5136 Other intervertebral disc degeneration, lumbar region: Secondary | ICD-10-CM | POA: Diagnosis not present

## 2020-11-11 MED ORDER — OXYCODONE HCL 10 MG PO TABS: 10.0000 mg | ORAL_TABLET | Freq: Two times a day (BID) | ORAL | 0 refills | Status: DC

## 2020-11-11 NOTE — Progress Notes (Signed)
Virtual Visit via Telephone Note  I connected with Whitney Rivera on 11/11/20 at  1:15 PM EDT by telephone and verified that I am speaking with the correct person using two identifiers.  Location: Patient: Home Provider: Pain control center   I discussed the limitations, risks, security and privacy concerns of performing an evaluation and management service by telephone and the availability of in person appointments. I also discussed with the patient that there may be a patient responsible charge related to this service. The patient expressed understanding and agreed to proceed.   History of Present Illness: I spoke with Whitney Rivera via telephone as she was unable to link up for the video portion of the virtual conference but she reports that she is doing well with her current oxycodone regimen.  She takes half of a 10 mg tablet twice a day sometimes 3 times a day on average.  This seems to keep her pain under good control.  The quality characteristic and distribution of her low back pain and leg pain is otherwise unchanged.  She uses a Daypro occasionally to help.  She still doing some of her exercises as best possible.  No change in lower extremity strength or function or bowel or bladder functions noted.  Otherwise she is in her usual state of health.   Observations/Objective:  Current Outpatient Medications:  .  aspirin EC 81 MG tablet, Take 81 mg by mouth daily., Disp: , Rfl:  .  cyclobenzaprine (FLEXERIL) 5 MG tablet, Take 1 tablet (5 mg total) by mouth daily., Disp: 30 tablet, Rfl: 6 .  lisinopril (ZESTRIL) 2.5 MG tablet, TAKE 1 TABLET BY MOUTH ONCE DAILY, Disp: 90 tablet, Rfl: 3 .  metoprolol tartrate (LOPRESSOR) 25 MG tablet, TAKE 1 TABLET BY MOUTH TWICE (2) DAILY, Disp: 60 tablet, Rfl: 3 .  metoprolol tartrate (LOPRESSOR) 50 MG tablet, Take 50 mg by mouth daily. (Patient not taking: Reported on 02/20/2020), Disp: , Rfl:  .  Multiple Vitamins-Minerals (MULTIVITAMIN WITH MINERALS) tablet,  Take 1 tablet by mouth daily., Disp: , Rfl:  .  [START ON 11/17/2020] Oxycodone HCl 10 MG TABS, Take 1 tablet (10 mg total) by mouth 2 (two) times daily., Disp: 60 tablet, Rfl: 0 .  [START ON 12/17/2020] Oxycodone HCl 10 MG TABS, Take 1 tablet (10 mg total) by mouth 2 (two) times daily., Disp: 60 tablet, Rfl: 0  Assessment and Plan:  1. Chronic pain syndrome   2. Chronic, continuous use of opioids   3. DDD (degenerative disc disease), lumbar   4. Facet arthropathy, lumbar   5. Low back derangement syndrome   6. Lumbar post-laminectomy syndrome   7. Facet arthritis of lumbosacral region   8. Chronic fatigue   9. Other forms of scoliosis, lumbar region   Based on our discussion today I think is appropriate refill her medications for the next 2 months dated from March 27 and April 26.  I have reviewed the [facility] practitioner database information and it is appropriate.  I talked to her about some options regarding dosing of her medication and she seems to be tolerating the opioid medicines well.  We talked about discontinuing Daypro and starting naproxen sodium 1 or 2 tablets twice a day 5 days a week.  I talked to her about some exercises today to help with stretching strengthening as well and I see no indication for any interventional therapy.  I want her to continue follow-up with her primary care physicians for baseline medical care with  return to clinic in 2 months. Follow Up Instructions:    I discussed the assessment and treatment plan with the patient. The patient was provided an opportunity to ask questions and all were answered. The patient agreed with the plan and demonstrated an understanding of the instructions.   The patient was advised to call back or seek an in-person evaluation if the symptoms worsen or if the condition fails to improve as anticipated.  I provided 30 minutes of non-face-to-face time during this encounter.   [name], MD

## 2021-01-08 ENCOUNTER — Other Ambulatory Visit: Payer: Self-pay

## 2021-01-08 ENCOUNTER — Encounter: Payer: Self-pay | Admitting: Anesthesiology

## 2021-01-08 ENCOUNTER — Ambulatory Visit: Payer: Medicare Other | Attending: Anesthesiology | Admitting: Anesthesiology

## 2021-01-08 DIAGNOSIS — G894 Chronic pain syndrome: Secondary | ICD-10-CM

## 2021-01-08 DIAGNOSIS — M47816 Spondylosis without myelopathy or radiculopathy, lumbar region: Secondary | ICD-10-CM | POA: Diagnosis not present

## 2021-01-08 DIAGNOSIS — M4186 Other forms of scoliosis, lumbar region: Secondary | ICD-10-CM | POA: Diagnosis not present

## 2021-01-08 DIAGNOSIS — R5382 Chronic fatigue, unspecified: Secondary | ICD-10-CM

## 2021-01-08 DIAGNOSIS — M961 Postlaminectomy syndrome, not elsewhere classified: Secondary | ICD-10-CM

## 2021-01-08 DIAGNOSIS — M5136 Other intervertebral disc degeneration, lumbar region: Secondary | ICD-10-CM | POA: Diagnosis not present

## 2021-01-08 DIAGNOSIS — M5386 Other specified dorsopathies, lumbar region: Secondary | ICD-10-CM

## 2021-01-08 DIAGNOSIS — F1190 Opioid use, unspecified, uncomplicated: Secondary | ICD-10-CM | POA: Diagnosis not present

## 2021-01-08 DIAGNOSIS — M47817 Spondylosis without myelopathy or radiculopathy, lumbosacral region: Secondary | ICD-10-CM | POA: Diagnosis not present

## 2021-01-08 MED ORDER — OXYCODONE HCL 10 MG PO TABS: 10.0000 mg | ORAL_TABLET | Freq: Two times a day (BID) | ORAL | 0 refills | Status: DC | PRN

## 2021-01-08 MED ORDER — OXYCODONE HCL 10 MG PO TABS: 10.0000 mg | ORAL_TABLET | Freq: Two times a day (BID) | ORAL | 0 refills | Status: DC

## 2021-01-08 NOTE — Progress Notes (Signed)
Virtual Visit via Telephone Note  I connected with Whitney Rivera on 01/08/21 at 10:00 AM EDT by telephone and verified that I am speaking with the correct person using two identifiers.  Location: Patient: Home Provider: Pain control center   I discussed the limitations, risks, security and privacy concerns of performing an evaluation and management service by telephone and the availability of in person appointments. I also discussed with the patient that there may be a patient responsible charge related to this service. The patient expressed understanding and agreed to proceed.   History of Present Illness: I spoke with Whitney Rivera today via telephone for the video portion of virtual conference and she reports that her low back pain has been slightly worse recently.  She is getting more pain radiating into both hips and occasionally into the buttocks as well as posterior legs.  This generally is well controlled with her medication and she takes half of an oxycodone tablet 4 times a day.  She is not really experiencing any significant spasming in the low back and her strength and bowel or bladder function have both been stable.  Otherwise no significant changes in the quality of pain are noted but the pain is more severe when she has been up and active.   Observations/Objective: Review of Systems  Constitutional: Negative for chills, fever and weight loss.  Eyes: Negative for blurred vision.  Respiratory: Negative for cough.   Cardiovascular: Negative for chest pain and palpitations.  Gastrointestinal: Negative for constipation and diarrhea.  Musculoskeletal: Positive for back pain and joint pain. Negative for myalgias.  Neurological: Negative for tingling and weakness.   Past Medical History:  Diagnosis Date  . CHF (congestive heart failure) (HCC)   . Chronic back pain   . Chronic constipation   . Chronic fatigue 10/28/2017  . Hypertension   . Lumbar post-laminectomy syndrome 05/10/2019   . Memory difficulty 10/28/2017  . Osteoporosis   . Scoliosis     Assessment and Plan:  1. Chronic pain syndrome   2. Chronic, continuous use of opioids   3. DDD (degenerative disc disease), lumbar   4. Facet arthropathy, lumbar   5. Low back derangement syndrome   6. Lumbar post-laminectomy syndrome   7. Facet arthritis of lumbosacral region   8. Chronic fatigue   9. Other forms of scoliosis, lumbar region    Follow Up Instructions: Based on our discussion today and upon review of the [facility] practitioner database information going to refill her medications.  She is well beyond her due date but taking her medications responsibly and less frequently than indicated and her next prescription will be dated for June 8 and July it.  I want her to continue efforts at stretching strengthening and if the pain is not better we talked about options for therapeutic epidural to see if this can help with some of the referred pain she is experiencing.  Otherwise continue stretching strengthening exercises and follow-up with her primary care physicians for baseline medical care with return to clinic in 2 months.  She will be due for a routine urine screen here soon   I discussed the assessment and treatment plan with the patient. The patient was provided an opportunity to ask questions and all were answered. The patient agreed with the plan and demonstrated an understanding of the instructions.   The patient was advised to call back or seek an in-person evaluation if the symptoms worsen or if the condition fails to improve as anticipated.  I provided 30 minutes of non-face-to-face time during this encounter.   Molli Barrows, MD

## 2021-01-14 ENCOUNTER — Other Ambulatory Visit: Payer: Self-pay | Admitting: Anesthesiology

## 2021-01-14 ENCOUNTER — Other Ambulatory Visit: Payer: Self-pay | Admitting: Internal Medicine

## 2021-01-15 ENCOUNTER — Other Ambulatory Visit: Payer: Self-pay | Admitting: *Deleted

## 2021-01-21 DIAGNOSIS — G894 Chronic pain syndrome: Secondary | ICD-10-CM | POA: Diagnosis not present

## 2021-01-28 LAB — TOXASSURE SELECT 13 (MW), URINE

## 2021-03-04 ENCOUNTER — Other Ambulatory Visit: Payer: Self-pay

## 2021-03-04 ENCOUNTER — Ambulatory Visit (INDEPENDENT_AMBULATORY_CARE_PROVIDER_SITE_OTHER): Payer: Medicare Other | Admitting: Internal Medicine

## 2021-03-04 ENCOUNTER — Encounter: Payer: Self-pay | Admitting: Internal Medicine

## 2021-03-04 VITALS — BP 124/69 | HR 72 | Ht 64.0 in | Wt 135.0 lb

## 2021-03-04 DIAGNOSIS — M5136 Other intervertebral disc degeneration, lumbar region: Secondary | ICD-10-CM | POA: Diagnosis not present

## 2021-03-04 DIAGNOSIS — R413 Other amnesia: Secondary | ICD-10-CM

## 2021-03-04 DIAGNOSIS — M4186 Other forms of scoliosis, lumbar region: Secondary | ICD-10-CM

## 2021-03-04 DIAGNOSIS — R5382 Chronic fatigue, unspecified: Secondary | ICD-10-CM | POA: Diagnosis not present

## 2021-03-04 NOTE — Assessment & Plan Note (Signed)
Back pain is stable patient was advised to walk every day

## 2021-03-04 NOTE — Assessment & Plan Note (Signed)
Patient was advised to do crossword puzzles

## 2021-03-04 NOTE — Addendum Note (Signed)
Addended by: Jobie Quaker on: 03/04/2021 03:21 PM   Modules accepted: Orders

## 2021-03-04 NOTE — Assessment & Plan Note (Signed)
Patient was advised to keep a journal and walk daily

## 2021-03-04 NOTE — Assessment & Plan Note (Signed)
-   Patient's back pain is under control with medication.  - Encouraged the patient to stretch or do yoga as able to help with back pain 

## 2021-03-04 NOTE — Progress Notes (Signed)
Established Patient Office Visit  Subjective:  Patient ID: Whitney Rivera, female    DOB: [birth year]  Age: 60 y.o. MRN: [id number]  CC:  Chief Complaint  Patient presents with   Insect Bite    Patient first noticed bite last Thursday. Patient states that she has no pain at the site but some itching.     HPI  Whitney Rivera presents for insect  bite.  Past Medical History:  Diagnosis Date   CHF (congestive heart failure) (HCC)    Chronic back pain    Chronic constipation    Chronic fatigue 10/28/2017   Hypertension    Lumbar post-laminectomy syndrome 05/10/2019   Memory difficulty 10/28/2017   Osteoporosis    Scoliosis     Past Surgical History:  Procedure Laterality Date   TUBAL LIGATION      Family History  Problem Relation Age of Onset   Heart disease Mother    Cancer Mother    Cancer Father    Hypertension Brother    Diabetes Brother     Social History   Socioeconomic History   Marital status: Divorced    Spouse name: Not on file   Number of children: Not on file   Years of education: Not on file   Highest education level: Not on file  Occupational History   Not on file  Tobacco Use   Smoking status: Former    Pack years: 0.00   Smokeless tobacco: Never  Substance and Sexual Activity   Alcohol use: No   Drug use: No   Sexual activity: Not on file  Other Topics Concern   Not on file  Social History Narrative   Not on file   Social Determinants of Health   Financial Resource Strain: Not on file  Food Insecurity: Not on file  Transportation Needs: Not on file  Physical Activity: Not on file  Stress: Not on file  Social Connections: Not on file  Intimate Partner Violence: Not on file     Current Outpatient Medications:    aspirin EC 81 MG tablet, Take 81 mg by mouth daily., Disp: , Rfl:    cyclobenzaprine (FLEXERIL) 5 MG tablet, Take 1 tablet (5 mg total) by mouth daily., Disp: 30 tablet, Rfl: 6   lisinopril (ZESTRIL) 2.5 MG tablet, TAKE 1  TABLET BY MOUTH ONCE DAILY, Disp: 90 tablet, Rfl: 3   metoprolol tartrate (LOPRESSOR) 25 MG tablet, TAKE 1 TABLET BY MOUTH TWICE (2) DAILY, Disp: 60 tablet, Rfl: 3   metoprolol tartrate (LOPRESSOR) 50 MG tablet, Take 50 mg by mouth daily. (Patient not taking: Reported on 02/20/2020), Disp: , Rfl:    Multiple Vitamins-Minerals (MULTIVITAMIN WITH MINERALS) tablet, Take 1 tablet by mouth daily., Disp: , Rfl:    Oxycodone HCl 10 MG TABS, Take 1 tablet (10 mg total) by mouth 2 (two) times daily., Disp: 60 tablet, Rfl: 0   Oxycodone HCl 10 MG TABS, Take 1 tablet (10 mg total) by mouth 2 (two) times daily., Disp: 60 tablet, Rfl: 0   Oxycodone HCl 10 MG TABS, Take 1 tablet (10 mg total) by mouth 2 (two) times daily as needed., Disp: 60 tablet, Rfl: 0   Allergies  Allergen Reactions   Ceclor [Cefaclor] Shortness Of Breath, Swelling and Rash   Ciprofloxacin Hives and Itching    ROS Review of Systems  Constitutional: Negative.   HENT: Negative.    Eyes: Negative.   Respiratory: Negative.    Cardiovascular: Negative.   Gastrointestinal: Negative.  Endocrine: Negative.   Genitourinary: Negative.   Musculoskeletal: Negative.   Skin: Negative.   Allergic/Immunologic: Negative.   Neurological: Negative.   Hematological: Negative.   Psychiatric/Behavioral: Negative.    All other systems reviewed and are negative.    Objective:    Physical Exam Vitals reviewed.  Constitutional:      Appearance: Normal appearance.  HENT:     Mouth/Throat:     Mouth: Mucous membranes are moist.  Eyes:     Pupils: Pupils are equal, round, and reactive to light.  Neck:     Vascular: No carotid bruit.  Cardiovascular:     Rate and Rhythm: Normal rate and regular rhythm.     Pulses: Normal pulses.     Heart sounds: Normal heart sounds.  Pulmonary:     Effort: Pulmonary effort is normal.     Breath sounds: Normal breath sounds.  Abdominal:     General: Bowel sounds are normal.     Palpations: Abdomen is  soft. There is no hepatomegaly, splenomegaly or mass.     Tenderness: There is no abdominal tenderness.     Hernia: No hernia is present.  Musculoskeletal:        General: No tenderness.     Cervical back: Neck supple.     Right lower leg: No edema.     Left lower leg: No edema.  Skin:    Findings: No rash.  Neurological:     Mental Status: She is alert and oriented to person, place, and time.     Motor: No weakness.  Psychiatric:        Mood and Affect: Mood and affect normal.        Behavior: Behavior normal.    BP 124/69   Pulse 72   Ht 5\' 4"  (1.626 m)   Wt 135 lb (61.2 kg)   BMI 23.17 kg/m  Wt Readings from Last 3 Encounters:  03/04/21 135 lb (61.2 kg)  02/20/20 128 lb (58.1 kg)  10/28/17 124 lb (56.2 kg)     Health Maintenance Due  Topic Date Due   COVID-19 Vaccine (1) Never done   Hepatitis C Screening  Never done   PAP SMEAR-Modifier  08/24/2018    There are no preventive care reminders to display for this patient.  No results found for: TSH Lab Results  Component Value Date   WBC 8.6 05/27/2015   HGB 14.0 05/27/2015   HCT 42.2 05/27/2015   MCV 86.4 05/27/2015   PLT 211 05/27/2015   Lab Results  Component Value Date   NA 136 05/27/2015   K 3.8 03/18/2017   CO2 25 05/27/2015   GLUCOSE 117 (H) 05/27/2015   BUN 9 05/27/2015   CREATININE 0.77 05/27/2015   BILITOT 0.5 05/27/2015   ALKPHOS 68 05/27/2015   AST 20 05/27/2015   ALT 11 (L) 05/27/2015   PROT 7.2 05/27/2015   ALBUMIN 4.4 05/27/2015   CALCIUM 9.1 05/27/2015   ANIONGAP 5 05/27/2015   No results found for: CHOL No results found for: HDL No results found for: LDLCALC No results found for: TRIG No results found for: CHOLHDL No results found for: 07/27/2015    Assessment & Plan:   Problem List Items Addressed This Visit       Musculoskeletal and Integument   Other forms of scoliosis, lumbar region - Primary    Back pain is stable patient was advised to walk every day       DDD  (degenerative disc  disease), lumbar    - Patient's back pain is under control with medication.  - Encouraged the patient to stretch or do yoga as able to help with back pain         Other   Memory difficulty    Patient was advised to do crossword puzzles       Chronic fatigue    Patient was advised to keep a journal and walk daily        No orders of the defined types were placed in this encounter.   Follow-up: No follow-ups on file.    Corky Downs, MD

## 2021-03-05 LAB — COMPLETE METABOLIC PANEL WITH GFR
AG Ratio: 2.1 (calc) (ref 1.0–2.5)
ALT: 11 U/L (ref 6–29)
AST: 20 U/L (ref 10–35)
Albumin: 4.4 g/dL (ref 3.6–5.1)
Alkaline phosphatase (APISO): 68 U/L (ref 37–153)
BUN: 14 mg/dL (ref 7–25)
CO2: 21 mmol/L (ref 20–32)
Calcium: 9.5 mg/dL (ref 8.6–10.4)
Chloride: 106 mmol/L (ref 98–110)
Creat: 0.98 mg/dL (ref 0.50–1.05)
Globulin: 2.1 g/dL (calc) (ref 1.9–3.7)
Glucose, Bld: 92 mg/dL (ref 65–99)
Potassium: 4.5 mmol/L (ref 3.5–5.3)
Sodium: 140 mmol/L (ref 135–146)
Total Bilirubin: 0.3 mg/dL (ref 0.2–1.2)
Total Protein: 6.5 g/dL (ref 6.1–8.1)
eGFR: 66 mL/min/{1.73_m2} (ref >=60)

## 2021-03-05 LAB — CBC WITH DIFFERENTIAL/PLATELET
Absolute Monocytes: 403 cells/uL (ref 200–950)
Basophils Absolute: 38 cells/uL (ref 0–200)
Basophils Relative: 0.6 %
Eosinophils Absolute: 265 cells/uL (ref 15–500)
Eosinophils Relative: 4.2 %
HCT: 45.2 % — ABNORMAL HIGH (ref 35.0–45.0)
Hemoglobin: 14.5 g/dL (ref 11.7–15.5)
Lymphs Abs: 2482 cells/uL (ref 850–3900)
MCH: 28.7 pg (ref 27.0–33.0)
MCHC: 32.1 g/dL (ref 32.0–36.0)
MCV: 89.3 fL (ref 80.0–100.0)
MPV: 11.5 fL (ref 7.5–12.5)
Monocytes Relative: 6.4 %
Neutro Abs: 3112 cells/uL (ref 1500–7800)
Neutrophils Relative %: 49.4 %
Platelets: 227 10*3/uL (ref 140–400)
RBC: 5.06 10*6/uL (ref 3.80–5.10)
RDW: 13.2 % (ref 11.0–15.0)
Total Lymphocyte: 39.4 %
WBC: 6.3 10*3/uL (ref 3.8–10.8)

## 2021-03-05 LAB — LIPID PANEL
Cholesterol: 230 mg/dL — ABNORMAL HIGH (ref <200)
HDL: 49 mg/dL — ABNORMAL LOW (ref >=50)
LDL Cholesterol (Calc): 149 mg/dL (calc) — ABNORMAL HIGH
Non-HDL Cholesterol (Calc): 181 mg/dL (calc) — ABNORMAL HIGH (ref <130)
Total CHOL/HDL Ratio: 4.7 (calc) (ref <5.0)
Triglycerides: 185 mg/dL — ABNORMAL HIGH (ref <150)

## 2021-03-05 LAB — TSH: TSH: 3.26 mIU/L (ref 0.40–4.50)

## 2021-03-13 ENCOUNTER — Other Ambulatory Visit: Payer: Self-pay

## 2021-03-13 ENCOUNTER — Encounter: Payer: Self-pay | Admitting: Family Medicine

## 2021-03-13 ENCOUNTER — Ambulatory Visit (INDEPENDENT_AMBULATORY_CARE_PROVIDER_SITE_OTHER): Payer: Medicare Other | Admitting: Family Medicine

## 2021-03-13 VITALS — BP 139/65 | HR 74 | Ht 64.0 in | Wt 135.0 lb

## 2021-03-13 DIAGNOSIS — E7800 Pure hypercholesterolemia, unspecified: Secondary | ICD-10-CM

## 2021-03-13 NOTE — Assessment & Plan Note (Signed)
Discussed elevated LDL today, her diet consist of highly processed frozen meals and not a lot of vegetables. Discussed diet therapy for now.   All other labs wnl.

## 2021-03-13 NOTE — Progress Notes (Signed)
Established Patient Office Visit  SUBJECTIVE:  Subjective  Patient ID: Whitney Rivera, female    DOB: 03-09-1961  Age: 60 y.o. MRN: [id number]  CC: No chief complaint on file.   HPI Whitney Rivera is a 60 y.o. female presenting today for     Past Medical History:  Diagnosis Date   CHF (congestive heart failure) (HCC)    Chronic back pain    Chronic constipation    Chronic fatigue 10/28/2017   Hypertension    Lumbar post-laminectomy syndrome 05/10/2019   Memory difficulty 10/28/2017   Osteoporosis    Scoliosis     Past Surgical History:  Procedure Laterality Date   TUBAL LIGATION      Family History  Problem Relation Age of Onset   Heart disease Mother    Cancer Mother    Cancer Father    Hypertension Brother    Diabetes Brother     Social History   Socioeconomic History   Marital status: Divorced    Spouse name: Not on file   Number of children: Not on file   Years of education: Not on file   Highest education level: Not on file  Occupational History   Not on file  Tobacco Use   Smoking status: Former   Smokeless tobacco: Never  Substance and Sexual Activity   Alcohol use: No   Drug use: No   Sexual activity: Not on file  Other Topics Concern   Not on file  Social History Narrative   Not on file   Social Determinants of Health   Financial Resource Strain: Not on file  Food Insecurity: Not on file  Transportation Needs: Not on file  Physical Activity: Not on file  Stress: Not on file  Social Connections: Not on file  Intimate Partner Violence: Not on file     Current Outpatient Medications:    aspirin EC 81 MG tablet, Take 81 mg by mouth daily., Disp: , Rfl:    cyclobenzaprine (FLEXERIL) 5 MG tablet, Take 1 tablet (5 mg total) by mouth daily., Disp: 30 tablet, Rfl: 6   lisinopril (ZESTRIL) 2.5 MG tablet, TAKE 1 TABLET BY MOUTH ONCE DAILY, Disp: 90 tablet, Rfl: 3   metoprolol tartrate (LOPRESSOR) 25 MG tablet, TAKE 1 TABLET BY MOUTH TWICE (2)  DAILY, Disp: 60 tablet, Rfl: 3   metoprolol tartrate (LOPRESSOR) 50 MG tablet, Take 50 mg by mouth daily., Disp: , Rfl:    Multiple Vitamins-Minerals (MULTIVITAMIN WITH MINERALS) tablet, Take 1 tablet by mouth daily., Disp: , Rfl:    Oxycodone HCl 10 MG TABS, Take 1 tablet (10 mg total) by mouth 2 (two) times daily as needed., Disp: 60 tablet, Rfl: 0   Oxycodone HCl 10 MG TABS, Take 1 tablet (10 mg total) by mouth 2 (two) times daily., Disp: 60 tablet, Rfl: 0   Oxycodone HCl 10 MG TABS, Take 1 tablet (10 mg total) by mouth 2 (two) times daily., Disp: 60 tablet, Rfl: 0   Allergies  Allergen Reactions   Ceclor [Cefaclor] Shortness Of Breath, Swelling and Rash   Ciprofloxacin Hives and Itching    ROS Review of Systems  Constitutional: Negative.   HENT: Negative.    Respiratory: Negative.    Cardiovascular: Negative.   Genitourinary: Negative.   Musculoskeletal: Negative.   Psychiatric/Behavioral: Negative.      OBJECTIVE:    Physical Exam HENT:     Right Ear: Tympanic membrane normal.  Cardiovascular:     Rate and Rhythm: Normal rate  and regular rhythm.  Pulmonary:     Effort: Pulmonary effort is normal.  Neurological:     General: No focal deficit present.     Mental Status: She is alert.  Psychiatric:        Mood and Affect: Mood normal.    BP 139/65   Pulse 74   Ht _0  (1.626 m)   Wt 135 lb (61.2 kg)   BMI 23.17 kg/m  Wt Readings from Last 3 Encounters:  03/13/21 135 lb (61.2 kg)  03/04/21 135 lb (61.2 kg)  02/20/20 128 lb (58.1 kg)    Health Maintenance Due  Topic Date Due   Hepatitis C Screening  Never done   PAP SMEAR-Modifier  08/24/2018   COVID-19 Vaccine (3 - Booster for Moderna series) 01/24/2021    There are no preventive care reminders to display for this patient.  CBC Latest Ref Rng & Units 03/04/2021 05/27/2015 02/26/2015  WBC 3.8 - 10.8 Thousand/uL 6.3 8.6 5.4  Hemoglobin 11.7 - 15.5 g/dL 14.5 14.0 14.1  Hematocrit 35.0 - 45.0 % 45.2(H) 42.2  42.1  Platelets 140 - 400 Thousand/uL 227 211 189   CMP Latest Ref Rng & Units 03/04/2021 03/18/2017 05/27/2015  Glucose 65 - 99 mg/dL 92 - 117(H)  BUN 7 - 25 mg/dL 14 - 9  Creatinine 0.50 - 1.05 mg/dL 0.98 - 0.77  Sodium 135 - 146 mmol/L 140 - 136  Potassium 3.5 - 5.3 mmol/L 4.5 3.8 3.8  Chloride 98 - 110 mmol/L 106 - 106  CO2 20 - 32 mmol/L 21 - 25  Calcium 8.6 - 10.4 mg/dL 9.5 - 9.1  Total Protein 6.1 - 8.1 g/dL 6.5 - 7.2  Total Bilirubin 0.2 - 1.2 mg/dL 0.3 - 0.5  Alkaline Phos 38 - 126 U/L - - 68  AST 10 - 35 U/L 20 - 20  ALT 6 - 29 U/L 11 - 11(L)    Lab Results  Component Value Date   TSH 3.26 03/04/2021   Lab Results  Component Value Date   ALBUMIN 4.4 05/27/2015   ANIONGAP 5 05/27/2015   EGFR 66 03/04/2021   Lab Results  Component Value Date   CHOL 230 (H) 03/04/2021   HDL 49 (L) 03/04/2021   LDLCALC 149 (H) 03/04/2021   CHOLHDL 4.7 03/04/2021   Lab Results  Component Value Date   TRIG 185 (H) 03/04/2021   No results found for: HGBA1C    ASSESSMENT & PLAN:   Problem List Items Addressed This Visit       Other   Pure hypercholesterolemia - Primary    Discussed elevated LDL today, her diet consist of highly processed frozen meals and not a lot of vegetables. Discussed diet therapy for now.   All other labs wnl.         No orders of the defined types were placed in this encounter.     Follow-up: No follow-ups on file.    Beckie Salts, [place] [address], [place], [place] [address]

## 2021-03-19 ENCOUNTER — Encounter: Payer: Self-pay | Admitting: Internal Medicine

## 2021-03-19 ENCOUNTER — Encounter: Payer: Self-pay | Admitting: Anesthesiology

## 2021-03-19 ENCOUNTER — Ambulatory Visit (INDEPENDENT_AMBULATORY_CARE_PROVIDER_SITE_OTHER): Payer: Medicare Other | Admitting: Internal Medicine

## 2021-03-19 ENCOUNTER — Ambulatory Visit: Payer: Medicare Other | Attending: Anesthesiology | Admitting: Anesthesiology

## 2021-03-19 ENCOUNTER — Other Ambulatory Visit: Payer: Self-pay

## 2021-03-19 VITALS — BP 133/79 | HR 91 | Ht 64.0 in | Wt 132.4 lb

## 2021-03-19 DIAGNOSIS — M5386 Other specified dorsopathies, lumbar region: Secondary | ICD-10-CM | POA: Diagnosis not present

## 2021-03-19 DIAGNOSIS — R1083 Colic: Secondary | ICD-10-CM

## 2021-03-19 DIAGNOSIS — R1084 Generalized abdominal pain: Secondary | ICD-10-CM

## 2021-03-19 DIAGNOSIS — F1190 Opioid use, unspecified, uncomplicated: Secondary | ICD-10-CM | POA: Diagnosis not present

## 2021-03-19 DIAGNOSIS — M5136 Other intervertebral disc degeneration, lumbar region: Secondary | ICD-10-CM

## 2021-03-19 DIAGNOSIS — M961 Postlaminectomy syndrome, not elsewhere classified: Secondary | ICD-10-CM

## 2021-03-19 DIAGNOSIS — R413 Other amnesia: Secondary | ICD-10-CM | POA: Diagnosis not present

## 2021-03-19 DIAGNOSIS — M47816 Spondylosis without myelopathy or radiculopathy, lumbar region: Secondary | ICD-10-CM

## 2021-03-19 DIAGNOSIS — G894 Chronic pain syndrome: Secondary | ICD-10-CM

## 2021-03-19 MED ORDER — DOXYCYCLINE HYCLATE 100 MG PO TABS: 100.0000 mg | ORAL_TABLET | Freq: Two times a day (BID) | ORAL | 0 refills | Status: DC

## 2021-03-19 MED ORDER — DICYCLOMINE HCL 10 MG PO CAPS: 10.0000 mg | ORAL_CAPSULE | Freq: Three times a day (TID) | ORAL | 0 refills | Status: DC

## 2021-03-19 MED ORDER — OXYCODONE HCL 10 MG PO TABS: 10.0000 mg | ORAL_TABLET | Freq: Two times a day (BID) | ORAL | 0 refills | Status: DC

## 2021-03-19 NOTE — Assessment & Plan Note (Signed)

## 2021-03-19 NOTE — Progress Notes (Signed)
Established Patient Office Visit  Subjective:  Patient ID: Whitney Rivera, female    DOB: Jun 20, 1961  Age: 60 y.o. MRN: [id number]  CC:  Chief Complaint  Patient presents with   Abdominal Pain   recent tick bite    Patient seen on 7/12 after pulling tick off on 7/6. Patient is now having abdominal pain, body aches, fatigue, severe headaches and has no appetite. Patient was not given antibiotic at last visit. Patient neg for covid.       Past Medical History:  Diagnosis Date   CHF (congestive heart failure) (HCC)    Chronic back pain    Chronic constipation    Chronic fatigue 10/28/2017   Hypertension    Lumbar post-laminectomy syndrome 05/10/2019   Memory difficulty 10/28/2017   Osteoporosis    Scoliosis     Past Surgical History:  Procedure Laterality Date   TUBAL LIGATION      Family History  Problem Relation Age of Onset   Heart disease Mother    Cancer Mother    Cancer Father    Hypertension Brother    Diabetes Brother     Social History   Socioeconomic History   Marital status: Divorced    Spouse name: Not on file   Number of children: Not on file   Years of education: Not on file   Highest education level: Not on file  Occupational History   Not on file  Tobacco Use   Smoking status: Former   Smokeless tobacco: Never  Substance and Sexual Activity   Alcohol use: No   Drug use: No   Sexual activity: Not on file  Other Topics Concern   Not on file  Social History Narrative   Not on file   Social Determinants of Health   Financial Resource Strain: Not on file  Food Insecurity: Not on file  Transportation Needs: Not on file  Physical Activity: Not on file  Stress: Not on file  Social Connections: Not on file  Intimate Partner Violence: Not on file     Current Outpatient Medications:    aspirin EC 81 MG tablet, Take 81 mg by mouth daily., Disp: , Rfl:    cyclobenzaprine (FLEXERIL) 5 MG tablet, Take 1 tablet (5 mg total) by mouth daily., Disp:  30 tablet, Rfl: 6   dicyclomine (BENTYL) 10 MG capsule, Take 1 capsule (10 mg total) by mouth 4 (four) times daily -  before meals and at bedtime., Disp: 20 capsule, Rfl: 0   doxycycline (VIBRA-TABS) 100 MG tablet, Take 1 tablet (100 mg total) by mouth 2 (two) times daily., Disp: 20 tablet, Rfl: 0   lisinopril (ZESTRIL) 2.5 MG tablet, TAKE 1 TABLET BY MOUTH ONCE DAILY, Disp: 90 tablet, Rfl: 3   metoprolol tartrate (LOPRESSOR) 25 MG tablet, TAKE 1 TABLET BY MOUTH TWICE (2) DAILY, Disp: 60 tablet, Rfl: 3   metoprolol tartrate (LOPRESSOR) 50 MG tablet, Take 50 mg by mouth daily., Disp: , Rfl:    Multiple Vitamins-Minerals (MULTIVITAMIN WITH MINERALS) tablet, Take 1 tablet by mouth daily., Disp: , Rfl:    Oxycodone HCl 10 MG TABS, Take 1 tablet (10 mg total) by mouth 2 (two) times daily as needed., Disp: 60 tablet, Rfl: 0   [START ON 04/07/2021] Oxycodone HCl 10 MG TABS, Take 1 tablet (10 mg total) by mouth 2 (two) times daily., Disp: 60 tablet, Rfl: 0   [START ON 05/07/2021] Oxycodone HCl 10 MG TABS, Take 1 tablet (10 mg total) by mouth  2 (two) times daily., Disp: 60 tablet, Rfl: 0   Allergies  Allergen Reactions   Ceclor [Cefaclor] Shortness Of Breath, Swelling and Rash   Ciprofloxacin Hives and Itching    ROS Review of Systems  Constitutional: Negative.   HENT: Negative.    Eyes: Negative.   Respiratory: Negative.    Cardiovascular: Negative.   Gastrointestinal:  Positive for abdominal pain.  Endocrine: Negative.   Genitourinary: Negative.   Musculoskeletal: Negative.   Skin: Negative.   Allergic/Immunologic: Negative.   Neurological: Negative.   Hematological: Negative.   Psychiatric/Behavioral: Negative.    All other systems reviewed and are negative.    Objective:    Physical Exam Vitals reviewed.  Constitutional:      Appearance: Normal appearance.  HENT:     Mouth/Throat:     Mouth: Mucous membranes are moist.  Eyes:     Pupils: Pupils are equal, round, and reactive to  light.  Neck:     Vascular: No carotid bruit.  Cardiovascular:     Rate and Rhythm: Normal rate and regular rhythm.     Pulses: Normal pulses.     Heart sounds: Normal heart sounds.  Pulmonary:     Effort: Pulmonary effort is normal.     Breath sounds: Normal breath sounds.  Abdominal:     General: Bowel sounds are normal.     Palpations: Abdomen is soft. There is no hepatomegaly, splenomegaly or mass.     Tenderness: There is no abdominal tenderness.     Hernia: No hernia is present.  Musculoskeletal:        General: No tenderness.     Cervical back: Neck supple.     Right lower leg: No edema.     Left lower leg: No edema.  Skin:    Findings: No rash.  Neurological:     Mental Status: She is alert and oriented to person, place, and time.     Motor: No weakness.  Psychiatric:        Mood and Affect: Mood and affect normal.        Behavior: Behavior normal.    BP 133/79   Pulse 91   Ht 5' 4"  (1.626 m)   Wt 132 lb 6.4 oz (60.1 kg)   BMI 22.73 kg/m  Wt Readings from Last 3 Encounters:  03/19/21 132 lb 6.4 oz (60.1 kg)  03/13/21 135 lb (61.2 kg)  03/04/21 135 lb (61.2 kg)     Health Maintenance Due  Topic Date Due   Hepatitis C Screening  Never done   PAP SMEAR-Modifier  08/24/2018   COVID-19 Vaccine (3 - Booster for Moderna series) 01/24/2021    There are no preventive care reminders to display for this patient.  Lab Results  Component Value Date   TSH 3.26 03/04/2021   Lab Results  Component Value Date   WBC 6.3 03/04/2021   HGB 14.5 03/04/2021   HCT 45.2 (H) 03/04/2021   MCV 89.3 03/04/2021   PLT 227 03/04/2021   Lab Results  Component Value Date   NA 140 03/04/2021   K 4.5 03/04/2021   CO2 21 03/04/2021   GLUCOSE 92 03/04/2021   BUN 14 03/04/2021   CREATININE 0.98 03/04/2021   BILITOT 0.3 03/04/2021   ALKPHOS 68 05/27/2015   AST 20 03/04/2021   ALT 11 03/04/2021   PROT 6.5 03/04/2021   ALBUMIN 4.4 05/27/2015   CALCIUM 9.5 03/04/2021    ANIONGAP 5 05/27/2015   EGFR 66 03/04/2021  Lab Results  Component Value Date   CHOL 230 (H) 03/04/2021   Lab Results  Component Value Date   HDL 49 (L) 03/04/2021   Lab Results  Component Value Date   LDLCALC 149 (H) 03/04/2021   Lab Results  Component Value Date   TRIG 185 (H) 03/04/2021   Lab Results  Component Value Date   CHOLHDL 4.7 03/04/2021   No results found for: HGBA1C    Assessment & Plan:   Problem List Items Addressed This Visit       Other   Memory difficulty      Memory Compensation Strategies   1. Use "WARM" strategy.             W= write it down             A= associate it             R= repeat it             M= make a mental note   2.   You can keep a Social worker.             Use a 3-ring notebook with sections for the following: calendar, important names and phone numbers,  medications, doctors' names/phone numbers, lists/reminders, and a section to journal what you did       each day.           3.    Use a calendar to write appointments down.   4.    Write yourself a schedule for the day.             This can be placed on the calendar or in a separate section of the Memory Notebook.  Keeping a            regular schedule can help memory.   5.    Use medication organizer with sections for each day or morning/evening pills.             You may need help loading it   6.    Keep a basket, or pegboard by the door.             Place items that you need to take out with you in the basket or on the pegboard.  You may also want to            include a message board for reminders.   7.    Use sticky notes.             Place sticky notes with reminders in a place where the task is performed.  For example: " turn off the            stove" placed by the stove, "lock the door" placed on the door at eye level, " take your medications" on          the bathroom mirror or by the place where you normally take your medications.   8.    Use  alarms/timers.             Use while cooking to remind yourself to check on food or as a reminder to take your medicine, or as a           reminder to make a call, or as a reminder to perform another task, etc.       Lumbar post-laminectomy syndrome    - Patient's back pain is under control with medication.  -  Encouraged the patient to stretch or do yoga as able to help with back pain       Abdominal colic - Primary    Patient was seen for abdominal pain.  Of lower abdominal swelling.  I initially wanted to send her to the hospital.  But she refused completely to go to the hospital I told her that if you get worse she should go to the emergency room). Patient was given Bentyl as directed and doxycycline 100 mg p.o. twice a day.  She is allergic to Cipro.       Relevant Medications   dicyclomine (BENTYL) 10 MG capsule   doxycycline (VIBRA-TABS) 100 MG tablet   Meds ordered this encounter  Medications   dicyclomine (BENTYL) 10 MG capsule    Sig: Take 1 capsule (10 mg total) by mouth 4 (four) times daily -  before meals and at bedtime.    Dispense:  20 capsule    Refill:  0   doxycycline (VIBRA-TABS) 100 MG tablet    Sig: Take 1 tablet (100 mg total) by mouth 2 (two) times daily.    Dispense:  20 tablet    Refill:  0     Follow-up: No follow-ups on file.    Cletis Athens, MD

## 2021-03-19 NOTE — Progress Notes (Signed)
Virtual Visit via Telephone Note  I connected with Whitney Rivera on 03/19/21 at  1:00 PM EDT by telephone and verified that I am speaking with the correct person using two identifiers.  Location: Patient: Home Provider: Pain control center   I discussed the limitations, risks, security and privacy concerns of performing an evaluation and management service by telephone and the availability of in person appointments. I also discussed with the patient that there may be a patient responsible charge related to this service. The patient expressed understanding and agreed to proceed.   History of Present Illness:  I spoke with Whitney Rivera today via telephone.  We were unable to link for the video portion of the conference but she reports that she has been doing reasonably well taking her oxycodone 5 mg tablets 4 times a day.  She has 10 mg tablets but breaks these in half and she has been using them more sparingly generally averaging more 3 times a day.  She denies any significant side effects other than sometimes it causes some mental clouding but the relief she gets with the medications has been good rated at 75% whereas she has failed more conservative therapy.  She has added in some holistic remedies including manuka honey as an anti-inflammatory and it seems to be helping her some 2.  No change in the quality characteristic or distribution of her low back pain is noted and otherwise she reports being in her usual state of health.  She is staying active doing her physical therapy exercises with no change in bowel or bladder function either.  Review of systems: General: No fevers or chills Pulmonary: No shortness of breath or dyspnea Cardiac: No angina or palpitations or lightheadedness GI: No abdominal pain or constipation Psych: No depression  Observations/Objective:  Current Outpatient Medications:    aspirin EC 81 MG tablet, Take 81 mg by mouth daily., Disp: , Rfl:    cyclobenzaprine (FLEXERIL) 5  MG tablet, Take 1 tablet (5 mg total) by mouth daily., Disp: 30 tablet, Rfl: 6   lisinopril (ZESTRIL) 2.5 MG tablet, TAKE 1 TABLET BY MOUTH ONCE DAILY, Disp: 90 tablet, Rfl: 3   metoprolol tartrate (LOPRESSOR) 25 MG tablet, TAKE 1 TABLET BY MOUTH TWICE (2) DAILY, Disp: 60 tablet, Rfl: 3   metoprolol tartrate (LOPRESSOR) 50 MG tablet, Take 50 mg by mouth daily., Disp: , Rfl:    Multiple Vitamins-Minerals (MULTIVITAMIN WITH MINERALS) tablet, Take 1 tablet by mouth daily., Disp: , Rfl:    Oxycodone HCl 10 MG TABS, Take 1 tablet (10 mg total) by mouth 2 (two) times daily as needed., Disp: 60 tablet, Rfl: 0   [START ON 04/07/2021] Oxycodone HCl 10 MG TABS, Take 1 tablet (10 mg total) by mouth 2 (two) times daily., Disp: 60 tablet, Rfl: 0   [START ON 05/07/2021] Oxycodone HCl 10 MG TABS, Take 1 tablet (10 mg total) by mouth 2 (two) times daily., Disp: 60 tablet, Rfl: 0   Assessment and Plan: 1. Chronic pain syndrome   2. Chronic, continuous use of opioids   3. DDD (degenerative disc disease), lumbar   4. Facet arthropathy, lumbar   5. Low back derangement syndrome   6. Lumbar post-laminectomy syndrome   Based on her discussion today and upon review of the [facility] practitioner database information I think it is appropriate to refill her medications for the next 2 months.  We will schedule these for August 15 and September 14.  I encouraged her to continue with conservative  utilization of the opioids and sparing impact if possible.  No other changes in her pain management protocol will be initiated today.  Continue follow-up with her primary care physicians and contact us of the pain control center should she have any questions or problems regarding her pain management with return to clinic in 2 months  Follow Up Instructions:    I discussed the assessment and treatment plan with the patient. The patient was provided an opportunity to ask questions and all were answered. The patient agreed with the  plan and demonstrated an understanding of the instructions.   The patient was advised to call back or seek an in-person evaluation if the symptoms worsen or if the condition fails to improve as anticipated.  I provided 30 minutes of non-face-to-face time during this encounter.   [name], MD

## 2021-03-19 NOTE — Assessment & Plan Note (Signed)
Patient was seen for abdominal pain.  Of lower abdominal swelling.  I initially wanted to send her to the hospital.  But she refused completely to go to the hospital I told her that if you get worse she should go to the emergency room). Patient was given Bentyl as directed and doxycycline 100 mg p.o. twice a day.  She is allergic to Cipro.

## 2021-03-19 NOTE — Assessment & Plan Note (Signed)
-   Patient's back pain is under control with medication.  - Encouraged the patient to stretch or do yoga as able to help with back pain 

## 2021-04-03 ENCOUNTER — Other Ambulatory Visit: Payer: Self-pay | Admitting: *Deleted

## 2021-04-03 DIAGNOSIS — Z1231 Encounter for screening mammogram for malignant neoplasm of breast: Secondary | ICD-10-CM

## 2021-04-08 ENCOUNTER — Other Ambulatory Visit: Payer: Self-pay | Admitting: Internal Medicine

## 2021-05-07 ENCOUNTER — Other Ambulatory Visit: Payer: Self-pay | Admitting: Internal Medicine

## 2021-05-09 ENCOUNTER — Other Ambulatory Visit: Payer: Self-pay | Admitting: *Deleted

## 2021-05-09 ENCOUNTER — Ambulatory Visit (INDEPENDENT_AMBULATORY_CARE_PROVIDER_SITE_OTHER): Payer: Medicare Other | Admitting: *Deleted

## 2021-05-09 DIAGNOSIS — Z0000 Encounter for general adult medical examination without abnormal findings: Secondary | ICD-10-CM | POA: Diagnosis not present

## 2021-05-09 MED ORDER — METOPROLOL TARTRATE 25 MG PO TABS: ORAL_TABLET | ORAL | 3 refills | Status: DC

## 2021-05-09 MED ORDER — LISINOPRIL 2.5 MG PO TABS: 2.5000 mg | ORAL_TABLET | Freq: Every day | ORAL | 6 refills | Status: DC

## 2021-05-09 NOTE — Progress Notes (Signed)
Subjective:   Whitney Rivera is a 60 y.o. female who presents for an Initial Medicare Annual Wellness Visit.  I discussed the limitations of evaluation and management by telemedicine and the availability of in person appointments. The patient expressed understanding and agreed to proceed.  Visit performed using audio  Patient: home Provider: home     Review of Systems    Defer to provider  Cardiac Risk Factors include: none     Objective:    There were no vitals filed for this visit. There is no height or weight on file to calculate BMI.  Advanced Directives 05/09/2021 04/16/2017 01/24/2016 01/06/2016 05/27/2015 02/26/2015  Does Patient Have a Medical Advance Directive? No No Yes Yes No No  Would patient like information on creating a medical advance directive? No - Patient declined - - - Yes - Educational materials given Yes - Transport planner given    Current Medications (verified) Outpatient Encounter Medications as of 05/09/2021  Medication Sig   aspirin EC 81 MG tablet Take 81 mg by mouth daily.   cyclobenzaprine (FLEXERIL) 5 MG tablet Take 1 tablet (5 mg total) by mouth daily.   dicyclomine (BENTYL) 10 MG capsule Take 1 capsule (10 mg total) by mouth 4 (four) times daily -  before meals and at bedtime.   doxycycline (VIBRA-TABS) 100 MG tablet Take 1 tablet (100 mg total) by mouth 2 (two) times daily.   lisinopril (ZESTRIL) 2.5 MG tablet TAKE 1 TABLET BY MOUTH ONCE DAILY   metoprolol tartrate (LOPRESSOR) 25 MG tablet TAKE 1 TABLET BY MOUTH TWICE (2) DAILY   Multiple Vitamins-Minerals (MULTIVITAMIN WITH MINERALS) tablet Take 1 tablet by mouth daily.   oxaprozin (DAYPRO) 600 MG tablet TAKE 1 TABLET BY MOUTH ONCE A DAY   Oxycodone HCl 10 MG TABS Take 1 tablet (10 mg total) by mouth 2 (two) times daily.   Oxycodone HCl 10 MG TABS Take 1 tablet (10 mg total) by mouth 2 (two) times daily as needed.   [DISCONTINUED] metoprolol tartrate (LOPRESSOR) 50 MG tablet Take 50 mg by mouth  daily.   [DISCONTINUED] Oxycodone HCl 10 MG TABS Take 1 tablet (10 mg total) by mouth 2 (two) times daily.   No facility-administered encounter medications on file as of 05/09/2021.    Allergies (verified) Ceclor [cefaclor] and Ciprofloxacin   History: Past Medical History:  Diagnosis Date   CHF (congestive heart failure) (HCC)    Chronic back pain    Chronic constipation    Chronic fatigue 10/28/2017   Hypertension    Lumbar post-laminectomy syndrome 05/10/2019   Memory difficulty 10/28/2017   Osteoporosis    Scoliosis    Past Surgical History:  Procedure Laterality Date   TUBAL LIGATION     Family History  Problem Relation Age of Onset   Heart disease Mother    Cancer Mother    Cancer Father    Hypertension Brother    Diabetes Brother    Social History   Socioeconomic History   Marital status: Divorced    Spouse name: Not on file   Number of children: Not on file   Years of education: Not on file   Highest education level: Not on file  Occupational History   Not on file  Tobacco Use   Smoking status: Former   Smokeless tobacco: Never  Substance and Sexual Activity   Alcohol use: No   Drug use: No   Sexual activity: Not on file  Other Topics Concern   Not on  file  Social History Narrative   Not on file   Social Determinants of Health   Financial Resource Strain: Low Risk    Difficulty of Paying Living Expenses: Not hard at all  Food Insecurity: No Food Insecurity   Worried About Programme researcher, broadcasting/film/video in the Last Year: Never true   Barista in the Last Year: Never true  Transportation Needs: No Transportation Needs   Lack of Transportation (Medical): No   Lack of Transportation (Non-Medical): No  Physical Activity: Insufficiently Active   Days of Exercise per Week: 2 days   Minutes of Exercise per Session: 20 min  Stress: No Stress Concern Present   Feeling of Stress : Not at all  Social Connections: Socially Isolated   Frequency of Communication  with Friends and Family: Three times a week   Frequency of Social Gatherings with Friends and Family: Three times a week   Attends Religious Services: Never   Active Member of Clubs or Organizations: No   Attends Engineer, structural: Never   Marital Status: Divorced    Tobacco Counseling Counseling given: Not Answered   Clinical Intake:  Pre-visit preparation completed: Yes  Pain : No/denies pain     Diabetes: No  How often do you need to have someone help you when you read instructions, pamphlets, or other written materials from your doctor or pharmacy?: 1 - Never What is the last grade level you completed in school?: 10th  Diabetic?No  Interpreter Needed?: No  Information entered by :: Melody Comas, CMA   Activities of Daily Living In your present state of health, do you have any difficulty performing the following activities: 05/09/2021  Hearing? N  Vision? N  Difficulty concentrating or making decisions? N  Walking or climbing stairs? N  Dressing or bathing? N  Doing errands, shopping? N  Preparing Food and eating ? N  Using the Toilet? N  In the past six months, have you accidently leaked urine? N  Do you have problems with loss of bowel control? N  Managing your Medications? N  Managing your Finances? N  Housekeeping or managing your Housekeeping? N  Some recent data might be hidden    Patient Care Team: Corky Downs, MD as PCP - General (Internal Medicine) [place], Marilynn Rail, PA-C as Physician Assistant (Physician Assistant)  Indicate any recent Medical Services you may have received from other than [facility] providers in the past year (date may be approximate).     Assessment:   This is a routine wellness examination for Whitney Rivera.  Hearing/Vision screen No results found.  Dietary issues and exercise activities discussed: Current Exercise Habits: Home exercise routine, Type of exercise: walking, Time (Minutes): 20, Frequency (Times/Week):  2, Weekly Exercise (Minutes/Week): 40, Intensity: Mild, Exercise limited by: None identified   Goals Addressed   None    Depression Screen PHQ 2/9 Scores 05/09/2021 03/04/2021  PHQ - 2 Score 0 0    Fall Risk Fall Risk  05/09/2021 03/04/2021 02/20/2020  Falls in the past year? 1 1 0  Number falls in past yr: 0 0 -  Injury with Fall? 1 1 -  Comment - Right ankle pain -  Risk for fall due to : History of fall(s) History of fall(s) -  Follow up Falls evaluation completed Falls evaluation completed -    FALL RISK PREVENTION PERTAINING TO THE HOME:  Any stairs in or around the home? No  If so, are there any without handrails? No  Home free of loose throw rugs in walkways, pet beds, electrical cords, etc? No  Adequate lighting in your home to reduce risk of falls? No   ASSISTIVE DEVICES UTILIZED TO PREVENT FALLS:  Life alert? No  Use of a cane, walker or w/c? No  Grab bars in the bathroom? No  Shower chair or bench in shower? No  Elevated toilet seat or a handicapped toilet? No   TIMED UP AND GO:  Was the test performed? No .  Length of time to ambulate: NA  Gait steady and fast without use of assistive device  Cognitive Function: MMSE - Mini Mental State Exam 05/09/2021 10/28/2017  Orientation to time 5 5  Orientation to Place 5 5  Registration 3 3  Attention/ Calculation 5 5  Recall 3 3  Language- name 2 objects 2 2  Language- repeat 1 1  Language- follow 3 step command 3 3  Language- read & follow direction 1 1  Write a sentence 0 1  Copy design 0 1  Total score 28 30     6CIT Screen 05/09/2021  What Year? 0 points  What month? 0 points  What time? 0 points  Count back from 20 0 points  Months in reverse 0 points  Repeat phrase 0 points  Total Score 0    Immunizations Immunization History  Administered Date(s) Administered   Moderna Sars-Covid-2 Vaccination 10/23/2019, 08/26/2020    TDAP status: Due, Education has been provided regarding the importance of  this vaccine. Advised may receive this vaccine at local pharmacy or Health Dept. Aware to provide a copy of the vaccination record if obtained from local pharmacy or Health Dept. Verbalized acceptance and understanding.  Flu Vaccine status: Declined, Education has been provided regarding the importance of this vaccine but patient still declined. Advised may receive this vaccine at local pharmacy or Health Dept. Aware to provide a copy of the vaccination record if obtained from local pharmacy or Health Dept. Verbalized acceptance and understanding.  Pneumococcal vaccine status: Declined,  Education has been provided regarding the importance of this vaccine but patient still declined. Advised may receive this vaccine at local pharmacy or Health Dept. Aware to provide a copy of the vaccination record if obtained from local pharmacy or Health Dept. Verbalized acceptance and understanding.   Covid-19 vaccine status: Information provided on how to obtain vaccines.   Qualifies for Shingles Vaccine? Yes   Zostavax completed No   Shingrix Completed?: No.    Education has been provided regarding the importance of this vaccine. Patient has been advised to call insurance company to determine out of pocket expense if they have not yet received this vaccine. Advised may also receive vaccine at local pharmacy or Health Dept. Verbalized acceptance and understanding.  Screening Tests Health Maintenance  Topic Date Due   Hepatitis C Screening  Never done   PAP SMEAR-Modifier  08/24/2018   COVID-19 Vaccine (3 - Booster for Moderna series) 01/24/2021   INFLUENZA VACCINE  Never done   Zoster Vaccines- Shingrix (1 of 2) 06/04/2021 (Originally 04/23/2009)   MAMMOGRAM  03/04/2022 (Originally 04/23/2009)   TETANUS/TDAP  03/04/2022 (Originally 04/23/1978)   COLONOSCOPY (Pts 45-39yrs Insurance coverage will need to be confirmed)  08/24/2024   HIV Screening  Completed   Pneumococcal Vaccine 69-82 Years old  Aged Out   HPV  VACCINES  Aged Out    Health Maintenance  Health Maintenance Due  Topic Date Due   Hepatitis C Screening  Never done  PAP SMEAR-Modifier  08/24/2018   COVID-19 Vaccine (3 - Booster for Moderna series) 01/24/2021   INFLUENZA VACCINE  Never done    Colorectal cancer screening: Type of screening: Colonoscopy. Completed 2017. Repeat every 10 years  Mammogram status: Ordered  . Pt provided with contact info and advised to call to schedule appt. : Patient declined Bone Density status: Ordered  . Pt provided with contact info and advised to call to schedule appt.: Patient declined   Lung Cancer Screening: (Low Dose CT Chest recommended if Age 52-80 years, 30 pack-year currently smoking OR have quit w/in 15years.) does not qualify.   Lung Cancer Screening Referral: NA  Additional Screening:  Hepatitis C Screening: does qualify; has not been Completed   Vision Screening: Recommended annual ophthalmology exams for early detection of glaucoma and other disorders of the eye. Is the patient up to date with their annual eye exam?  No  Who is the provider or what is the name of the office in which the patient attends annual eye exams? Does not have provider If pt is not established with a provider, would they like to be referred to a provider to establish care? No .   Dental Screening: Recommended annual dental exams for proper oral hygiene  Community Resource Referral / Chronic Care Management: CRR required this visit?  No   CCM required this visit?  No      Plan:     I have personally reviewed and noted the following in the patient's chart:   Medical and social history Use of alcohol, tobacco or illicit drugs  Current medications and supplements including opioid prescriptions. Patient is currently taking opioid prescriptions. Information provided to patient regarding non-opioid alternatives. Patient advised to discuss non-opioid treatment plan with their provider. Functional  ability and status Nutritional status Physical activity Advanced directives List of other physicians Hospitalizations, surgeries, and ER visits in previous 12 months Vitals Screenings to include cognitive, depression, and falls Referrals and appointments  In addition, I have reviewed and discussed with patient certain preventive protocols, quality metrics, and best practice recommendations. A written personalized care plan for preventive services as well as general preventive health recommendations were provided to patient.     Melody Comas, [place]   05/09/2021   Nurse Notes:  Ms. Nazaryan , Thank you for taking time to come for your Medicare Wellness Visit. I appreciate your ongoing commitment to your health goals. Please review the following plan we discussed and let me know if I can assist you in the future.   These are the goals we discussed:  Goals   None     This is a list of the screening recommended for you and due dates:  Health Maintenance  Topic Date Due   Hepatitis C Screening: USPSTF Recommendation to screen - Ages 39-79 yo.  Never done   Pap Smear  08/24/2018   COVID-19 Vaccine (3 - Booster for Moderna series) 01/24/2021   Flu Shot  Never done   Zoster (Shingles) Vaccine (1 of 2) 06/04/2021*   Mammogram  03/04/2022*   Tetanus Vaccine  03/04/2022*   Colon Cancer Screening  08/24/2024   HIV Screening  Completed   Pneumococcal Vaccination  Aged Out   HPV Vaccine  Aged Out  *Topic was postponed. The date shown is not the original due date.       Time spent with patient 40 min

## 2021-05-10 NOTE — Progress Notes (Signed)
I have reviewed this visit and agree with the documentation.   

## 2021-05-13 ENCOUNTER — Ambulatory Visit: Payer: Medicare Other | Attending: Anesthesiology | Admitting: Anesthesiology

## 2021-05-13 ENCOUNTER — Encounter: Payer: Self-pay | Admitting: Anesthesiology

## 2021-05-13 ENCOUNTER — Other Ambulatory Visit: Payer: Self-pay

## 2021-05-13 DIAGNOSIS — M5386 Other specified dorsopathies, lumbar region: Secondary | ICD-10-CM | POA: Diagnosis not present

## 2021-05-13 DIAGNOSIS — M5136 Other intervertebral disc degeneration, lumbar region: Secondary | ICD-10-CM | POA: Diagnosis not present

## 2021-05-13 DIAGNOSIS — M47817 Spondylosis without myelopathy or radiculopathy, lumbosacral region: Secondary | ICD-10-CM

## 2021-05-13 DIAGNOSIS — G894 Chronic pain syndrome: Secondary | ICD-10-CM | POA: Diagnosis not present

## 2021-05-13 DIAGNOSIS — M47816 Spondylosis without myelopathy or radiculopathy, lumbar region: Secondary | ICD-10-CM | POA: Diagnosis not present

## 2021-05-13 DIAGNOSIS — R5382 Chronic fatigue, unspecified: Secondary | ICD-10-CM

## 2021-05-13 DIAGNOSIS — F1190 Opioid use, unspecified, uncomplicated: Secondary | ICD-10-CM | POA: Diagnosis not present

## 2021-05-13 DIAGNOSIS — M961 Postlaminectomy syndrome, not elsewhere classified: Secondary | ICD-10-CM | POA: Diagnosis not present

## 2021-05-13 MED ORDER — OXYCODONE HCL 10 MG PO TABS: 5.0000 mg | ORAL_TABLET | Freq: Four times a day (QID) | ORAL | 0 refills | Status: DC

## 2021-05-13 NOTE — Progress Notes (Signed)
Virtual Visit via Telephone Note  I connected with Whitney Rivera on 05/13/21 at  4:20 PM EDT by telephone and verified that I am speaking with the correct person using two identifiers.  Location: Patient: Home Provider: Pain control center   I discussed the limitations, risks, security and privacy concerns of performing an evaluation and management service by telephone and the availability of in person appointments. I also discussed with the patient that there may be a patient responsible charge related to this service. The patient expressed understanding and agreed to proceed.   History of Present Illness: I spoke to Whitney Rivera via telephone as we were unable to link for the video portion of the virtual conference and she reports that she still having a considerable amount of low back pain and bilateral hip pain.  This is her baseline and no recent changes are noted but the quality characteristic continue to be fairly severe.  She takes her oxycodone begrudgingly but it continues to work for her.  It occasionally makes her a little bit foggy headed.  She takes 5 mg 4 times a day and it gives her about 70% relief.  She takes anti-inflammatory medications and some periodic Tylenol to assist.  She is staying active and doing her physical therapy exercises.  She tries to take some over-the-counter organic anti-inflammatory medications additionally to see if she can get relief thereto.  Otherwise she is in her usual state of health.  She continues to derive good functional lifestyle improvement with the medicines  Review of systems: General: No fevers or chills Pulmonary: No shortness of breath or dyspnea Cardiac: No angina or palpitations or lightheadedness GI: No abdominal pain or constipation Psych: No depression    Observations/Objective:  Current Outpatient Medications:    aspirin EC 81 MG tablet, Take 81 mg by mouth daily., Disp: , Rfl:    cyclobenzaprine (FLEXERIL) 5 MG tablet, Take 1 tablet (5  mg total) by mouth daily., Disp: 30 tablet, Rfl: 6   dicyclomine (BENTYL) 10 MG capsule, Take 1 capsule (10 mg total) by mouth 4 (four) times daily -  before meals and at bedtime., Disp: 20 capsule, Rfl: 0   doxycycline (VIBRA-TABS) 100 MG tablet, Take 1 tablet (100 mg total) by mouth 2 (two) times daily., Disp: 20 tablet, Rfl: 0   lisinopril (ZESTRIL) 2.5 MG tablet, Take 1 tablet (2.5 mg total) by mouth daily., Disp: 30 tablet, Rfl: 6   metoprolol tartrate (LOPRESSOR) 25 MG tablet, TAKE 1 TABLET BY MOUTH TWICE (2) DAILY, Disp: 60 tablet, Rfl: 3   Multiple Vitamins-Minerals (MULTIVITAMIN WITH MINERALS) tablet, Take 1 tablet by mouth daily., Disp: , Rfl:    oxaprozin (DAYPRO) 600 MG tablet, TAKE 1 TABLET BY MOUTH ONCE A DAY, Disp: 30 tablet, Rfl: 4   [START ON 06/06/2021] Oxycodone HCl 10 MG TABS, Take 0.5 tablets (5 mg total) by mouth in the morning, at noon, in the evening, and at bedtime., Disp: 60 tablet, Rfl: 0   [START ON 07/05/2021] Oxycodone HCl 10 MG TABS, Take 0.5 tablets (5 mg total) by mouth 4 (four) times daily., Disp: 60 tablet, Rfl: 0   Assessment and Plan: 1. Chronic pain syndrome   2. Chronic, continuous use of opioids   3. DDD (degenerative disc disease), lumbar   4. Facet arthropathy, lumbar   5. Low back derangement syndrome   6. Lumbar post-laminectomy syndrome   7. Facet arthritis of lumbosacral region   8. Chronic fatigue   Based on our discussion and  upon review of the [facility] practitioner database information is appropriate for refill for medications.  These will be dated for October 14 and November 13.  No other changes in her pain management protocol will be initiated.  I want her to continue with her physical therapy and activity level.  We will schedule her for 37-month return to clinic and I want her to continue follow-up with her primary care physicians for baseline medical care.  Follow Up Instructions:    I discussed the assessment and treatment plan with  the patient. The patient was provided an opportunity to ask questions and all were answered. The patient agreed with the plan and demonstrated an understanding of the instructions.   The patient was advised to call back or seek an in-person evaluation if the symptoms worsen or if the condition fails to improve as anticipated.  I provided 30 minutes of non-face-to-face time during this encounter.   [name], MD

## 2021-06-11 ENCOUNTER — Ambulatory Visit (INDEPENDENT_AMBULATORY_CARE_PROVIDER_SITE_OTHER): Payer: Medicare Other | Admitting: Internal Medicine

## 2021-06-11 ENCOUNTER — Other Ambulatory Visit: Payer: Self-pay

## 2021-06-11 ENCOUNTER — Encounter: Payer: Self-pay | Admitting: Internal Medicine

## 2021-06-11 VITALS — BP 134/74 | HR 84 | Ht 64.0 in | Wt 134.2 lb

## 2021-06-11 DIAGNOSIS — E7800 Pure hypercholesterolemia, unspecified: Secondary | ICD-10-CM | POA: Diagnosis not present

## 2021-06-11 DIAGNOSIS — R413 Other amnesia: Secondary | ICD-10-CM

## 2021-06-11 DIAGNOSIS — M5136 Other intervertebral disc degeneration, lumbar region: Secondary | ICD-10-CM

## 2021-06-11 DIAGNOSIS — F419 Anxiety disorder, unspecified: Secondary | ICD-10-CM

## 2021-06-11 DIAGNOSIS — M961 Postlaminectomy syndrome, not elsewhere classified: Secondary | ICD-10-CM | POA: Diagnosis not present

## 2021-06-11 DIAGNOSIS — R5382 Chronic fatigue, unspecified: Secondary | ICD-10-CM | POA: Diagnosis not present

## 2021-06-11 MED ORDER — ALPRAZOLAM 0.25 MG PO TABS
0.2500 mg | ORAL_TABLET | Freq: Two times a day (BID) | ORAL | 0 refills | Status: AC | PRN
Start: 2021-06-11 — End: 2021-07-11

## 2021-06-11 NOTE — Assessment & Plan Note (Signed)
Labile 

## 2021-06-11 NOTE — Assessment & Plan Note (Signed)
-   Patient's back pain is under control with medication.  - Encouraged the patient to stretch or do yoga as able to help with back pain 

## 2021-06-11 NOTE — Assessment & Plan Note (Signed)
She continues to have intermittent pain in the back, there is no radiculopathy

## 2021-06-11 NOTE — Assessment & Plan Note (Signed)
Patient was advised to walk on a daily basis 

## 2021-06-11 NOTE — Assessment & Plan Note (Signed)
Hypercholesterolemia  I advised the patient to follow Mediterranean diet This diet is rich in fruits vegetables and whole grain, and This diet is also rich in fish and lean meat Patient should also eat a handful of almonds or walnuts daily Recent heart study indicated that average follow-up on this kind of diet reduces the cardiovascular mortality by 50 to 70%== 

## 2021-06-11 NOTE — Progress Notes (Signed)
Established Patient Office Visit  Subjective:  Patient ID: Whitney Rivera, female    DOB: December 13, 1960  Age: 60 y.o. MRN: [id number]  CC:  Chief Complaint  Patient presents with   Follow-up    HPI  Whitney Rivera presents for general check  Past Medical History:  Diagnosis Date   CHF (congestive heart failure) ([place])    Chronic back pain    Chronic constipation    Chronic fatigue 10/28/2017   Hypertension    Lumbar post-laminectomy syndrome 05/10/2019   Memory difficulty 10/28/2017   Osteoporosis    Scoliosis     Past Surgical History:  Procedure Laterality Date   TUBAL LIGATION      Family History  Problem Relation Age of Onset   Heart disease Mother    Cancer Mother    Cancer Father    Hypertension Brother    Diabetes Brother     Social History   Socioeconomic History   Marital status: Divorced    Spouse name: Not on file   Number of children: Not on file   Years of education: Not on file   Highest education level: Not on file  Occupational History   Not on file  Tobacco Use   Smoking status: Former   Smokeless tobacco: Never  Substance and Sexual Activity   Alcohol use: No   Drug use: No   Sexual activity: Not on file  Other Topics Concern   Not on file  Social History Narrative   Not on file   Social Determinants of Health   Financial Resource Strain: Low Risk    Difficulty of Paying Living Expenses: Not hard at all  Food Insecurity: No Food Insecurity   Worried About Charity fundraiser in the Last Year: Never true   [place] in the Last Year: Never true  Transportation Needs: No Transportation Needs   Lack of Transportation (Medical): No   Lack of Transportation (Non-Medical): No  Physical Activity: Insufficiently Active   Days of Exercise per Week: 2 days   Minutes of Exercise per Session: 20 min  Stress: No Stress Concern Present   Feeling of Stress : Not at all  Social Connections: Socially Isolated   Frequency of Communication  with Friends and Family: Three times a week   Frequency of Social Gatherings with Friends and Family: Three times a week   Attends Religious Services: Never   Active Member of Clubs or Organizations: No   Attends Archivist Meetings: Never   Marital Status: Divorced  Human resources officer Violence: Not At Risk   Fear of Current or Ex-Partner: No   Emotionally Abused: No   Physically Abused: No   Sexually Abused: No     Current Outpatient Medications:    ALPRAZolam (XANAX) 0.25 MG tablet, Take 1 tablet (0.25 mg total) by mouth 2 (two) times daily as needed for anxiety., Disp: 20 tablet, Rfl: 0   aspirin EC 81 MG tablet, Take 81 mg by mouth daily., Disp: , Rfl:    cyclobenzaprine (FLEXERIL) 5 MG tablet, Take 1 tablet (5 mg total) by mouth daily., Disp: 30 tablet, Rfl: 6   dicyclomine (BENTYL) 10 MG capsule, Take 1 capsule (10 mg total) by mouth 4 (four) times daily -  before meals and at bedtime., Disp: 20 capsule, Rfl: 0   doxycycline (VIBRA-TABS) 100 MG tablet, Take 1 tablet (100 mg total) by mouth 2 (two) times daily., Disp: 20 tablet, Rfl: 0   lisinopril (ZESTRIL)  2.5 MG tablet, Take 1 tablet (2.5 mg total) by mouth daily., Disp: 30 tablet, Rfl: 6   metoprolol tartrate (LOPRESSOR) 25 MG tablet, TAKE 1 TABLET BY MOUTH TWICE (2) DAILY, Disp: 60 tablet, Rfl: 3   Multiple Vitamins-Minerals (MULTIVITAMIN WITH MINERALS) tablet, Take 1 tablet by mouth daily., Disp: , Rfl:    oxaprozin (DAYPRO) 600 MG tablet, TAKE 1 TABLET BY MOUTH ONCE A DAY, Disp: 30 tablet, Rfl: 4   Oxycodone HCl 10 MG TABS, Take 0.5 tablets (5 mg total) by mouth in the morning, at noon, in the evening, and at bedtime., Disp: 60 tablet, Rfl: 0   [START ON 07/05/2021] Oxycodone HCl 10 MG TABS, Take 0.5 tablets (5 mg total) by mouth 4 (four) times daily., Disp: 60 tablet, Rfl: 0   Allergies  Allergen Reactions   Ceclor [Cefaclor] Shortness Of Breath, Swelling and Rash   Ciprofloxacin Hives and Itching    ROS Review  of Systems  Constitutional: Negative.   HENT: Negative.    Eyes: Negative.   Respiratory: Negative.    Cardiovascular: Negative.   Gastrointestinal: Negative.   Endocrine: Negative.   Genitourinary: Negative.   Musculoskeletal: Negative.   Skin: Negative.   Allergic/Immunologic: Negative.   Neurological: Negative.   Hematological: Negative.   Psychiatric/Behavioral: Negative.    All other systems reviewed and are negative.    Objective:    Physical Exam Vitals reviewed.  Constitutional:      Appearance: Normal appearance.  HENT:     Mouth/Throat:     Mouth: Mucous membranes are moist.  Eyes:     Pupils: Pupils are equal, round, and reactive to light.  Neck:     Vascular: No carotid bruit.  Cardiovascular:     Rate and Rhythm: Normal rate and regular rhythm.     Pulses: Normal pulses.     Heart sounds: Normal heart sounds.  Pulmonary:     Effort: Pulmonary effort is normal.     Breath sounds: Normal breath sounds.  Abdominal:     General: Bowel sounds are normal.     Palpations: Abdomen is soft. There is no hepatomegaly, splenomegaly or mass.     Tenderness: There is no abdominal tenderness.     Hernia: No hernia is present.  Musculoskeletal:        General: No tenderness.     Cervical back: Neck supple.     Right lower leg: No edema.     Left lower leg: No edema.  Skin:    Findings: No rash.  Neurological:     Mental Status: She is alert and oriented to person, place, and time.     Motor: No weakness.  Psychiatric:        Mood and Affect: Mood and affect normal.        Behavior: Behavior normal.    BP 134/74   Pulse 84   Ht 5' 4"  (1.626 m)   Wt 134 lb 3.2 oz (60.9 kg)   BMI 23.04 kg/m  Wt Readings from Last 3 Encounters:  06/11/21 134 lb 3.2 oz (60.9 kg)  03/19/21 132 lb 6.4 oz (60.1 kg)  03/13/21 135 lb (61.2 kg)     Health Maintenance Due  Topic Date Due   Hepatitis C Screening  Never done   Zoster Vaccines- Shingrix (1 of 2) Never done    PAP SMEAR-Modifier  08/24/2018   COVID-19 Vaccine (3 - Booster for Moderna series) 01/24/2021   INFLUENZA VACCINE  Never done  There are no preventive care reminders to display for this patient.  Lab Results  Component Value Date   TSH 3.26 03/04/2021   Lab Results  Component Value Date   WBC 6.3 03/04/2021   HGB 14.5 03/04/2021   HCT 45.2 (H) 03/04/2021   MCV 89.3 03/04/2021   PLT 227 03/04/2021   Lab Results  Component Value Date   NA 140 03/04/2021   K 4.5 03/04/2021   CO2 21 03/04/2021   GLUCOSE 92 03/04/2021   BUN 14 03/04/2021   CREATININE 0.98 03/04/2021   BILITOT 0.3 03/04/2021   ALKPHOS 68 05/27/2015   AST 20 03/04/2021   ALT 11 03/04/2021   PROT 6.5 03/04/2021   ALBUMIN 4.4 05/27/2015   CALCIUM 9.5 03/04/2021   ANIONGAP 5 05/27/2015   EGFR 66 03/04/2021   Lab Results  Component Value Date   CHOL 230 (H) 03/04/2021   Lab Results  Component Value Date   HDL 49 (L) 03/04/2021   Lab Results  Component Value Date   LDLCALC 149 (H) 03/04/2021   Lab Results  Component Value Date   TRIG 185 (H) 03/04/2021   Lab Results  Component Value Date   CHOLHDL 4.7 03/04/2021   No results found for: HGBA1C    Assessment & Plan:   Problem List Items Addressed This Visit       Musculoskeletal and Integument   DDD (degenerative disc disease), lumbar    - Patient's back pain is under control with medication.  - Encouraged the patient to stretch or do yoga as able to help with back pain        Other   Memory difficulty    Labile      Chronic fatigue    Patient was advised to walk on a daily basis      Lumbar post-laminectomy syndrome    She continues to have intermittent pain in the back, there is no radiculopathy      Pure hypercholesterolemia    Hypercholesterolemia  I advised the patient to follow Mediterranean diet This diet is rich in fruits vegetables and whole grain, and This diet is also rich in fish and lean meat Patient should  also eat a handful of almonds or walnuts daily Recent heart study indicated that average follow-up on this kind of diet reduces the cardiovascular mortality by 50 to 70%==      Other Visit Diagnoses     Anxiety    -  Primary   Relevant Medications   ALPRAZolam (XANAX) 0.25 MG tablet       Meds ordered this encounter  Medications   ALPRAZolam (XANAX) 0.25 MG tablet    Sig: Take 1 tablet (0.25 mg total) by mouth 2 (two) times daily as needed for anxiety.    Dispense:  20 tablet    Refill:  0    Follow-up: No follow-ups on file.    Cletis Athens, MD

## 2021-07-08 ENCOUNTER — Encounter: Payer: Self-pay | Admitting: Anesthesiology

## 2021-07-08 ENCOUNTER — Ambulatory Visit: Payer: Medicare Other | Attending: Anesthesiology | Admitting: Anesthesiology

## 2021-07-08 ENCOUNTER — Other Ambulatory Visit: Payer: Self-pay

## 2021-07-08 DIAGNOSIS — M5386 Other specified dorsopathies, lumbar region: Secondary | ICD-10-CM

## 2021-07-08 DIAGNOSIS — G894 Chronic pain syndrome: Secondary | ICD-10-CM | POA: Diagnosis not present

## 2021-07-08 DIAGNOSIS — F1190 Opioid use, unspecified, uncomplicated: Secondary | ICD-10-CM

## 2021-07-08 DIAGNOSIS — M5136 Other intervertebral disc degeneration, lumbar region: Secondary | ICD-10-CM

## 2021-07-08 DIAGNOSIS — M4186 Other forms of scoliosis, lumbar region: Secondary | ICD-10-CM

## 2021-07-08 DIAGNOSIS — R5382 Chronic fatigue, unspecified: Secondary | ICD-10-CM

## 2021-07-08 DIAGNOSIS — M47816 Spondylosis without myelopathy or radiculopathy, lumbar region: Secondary | ICD-10-CM | POA: Diagnosis not present

## 2021-07-08 DIAGNOSIS — M961 Postlaminectomy syndrome, not elsewhere classified: Secondary | ICD-10-CM | POA: Diagnosis not present

## 2021-07-08 DIAGNOSIS — M47817 Spondylosis without myelopathy or radiculopathy, lumbosacral region: Secondary | ICD-10-CM | POA: Diagnosis not present

## 2021-07-08 MED ORDER — OXYCODONE HCL 10 MG PO TABS: 5.0000 mg | ORAL_TABLET | Freq: Four times a day (QID) | ORAL | 0 refills | Status: DC

## 2021-07-08 NOTE — Progress Notes (Signed)
Virtual Visit via Telephone Note  I connected with Whitney Rivera on 07/08/21 at  1:00 PM EST by telephone and verified that I am speaking with the correct person using two identifiers.  Location: Patient: Home Provider: [facility] control center   I discussed the limitations, risks, security and privacy concerns of performing an evaluation and management service by telephone and the availability of in person appointments. I also discussed with the patient that there may be a patient responsible charge related to this service. The patient expressed understanding and agreed to proceed.   History of Present Illness: I spoke with Whitney Rivera over the telephone as she was unable to do the video portion of virtual conference but she reports that she is doing reasonably well.  She is able to take her oxycodone tablets approximately 1/2 tablet 4 times a day and has been trying to restrict this to 3 times a day.  These continue to work well for her and keep her pain under reasonable control.  She is staying active and sleeping well at night.  She does mention that she has had some bouts of anxiety that she attributes to some of her other medications and is working on this with her primary care physicians.  Otherwise she is in her usual state of health with no change in the quality characteristic or distribution of her low back pain hip pain or leg pain.  Review of systems: General: No fevers or chills Pulmonary: No shortness of breath or dyspnea Cardiac: No angina or palpitations or lightheadedness GI: No abdominal pain or constipation Psych: No depression    Observations/Objective:  Current Outpatient Medications:    [START ON 09/03/2021] Oxycodone HCl 10 MG TABS, Take 0.5 tablets (5 mg total) by mouth 4 (four) times daily., Disp: 60 tablet, Rfl: 0   ALPRAZolam (XANAX) 0.25 MG tablet, Take 1 tablet (0.25 mg total) by mouth 2 (two) times daily as needed for anxiety., Disp: 20 tablet, Rfl: 0   aspirin EC 81 MG  tablet, Take 81 mg by mouth daily., Disp: , Rfl:    cyclobenzaprine (FLEXERIL) 5 MG tablet, Take 1 tablet (5 mg total) by mouth daily., Disp: 30 tablet, Rfl: 6   dicyclomine (BENTYL) 10 MG capsule, Take 1 capsule (10 mg total) by mouth 4 (four) times daily -  before meals and at bedtime., Disp: 20 capsule, Rfl: 0   doxycycline (VIBRA-TABS) 100 MG tablet, Take 1 tablet (100 mg total) by mouth 2 (two) times daily., Disp: 20 tablet, Rfl: 0   lisinopril (ZESTRIL) 2.5 MG tablet, Take 1 tablet (2.5 mg total) by mouth daily., Disp: 30 tablet, Rfl: 6   metoprolol tartrate (LOPRESSOR) 25 MG tablet, TAKE 1 TABLET BY MOUTH TWICE (2) DAILY, Disp: 60 tablet, Rfl: 3   Multiple Vitamins-Minerals (MULTIVITAMIN WITH MINERALS) tablet, Take 1 tablet by mouth daily., Disp: , Rfl:    oxaprozin (DAYPRO) 600 MG tablet, TAKE 1 TABLET BY MOUTH ONCE A DAY, Disp: 30 tablet, Rfl: 4   Oxycodone HCl 10 MG TABS, Take 0.5 tablets (5 mg total) by mouth in the morning, at noon, in the evening, and at bedtime., Disp: 60 tablet, Rfl: 0   [START ON 08/04/2021] Oxycodone HCl 10 MG TABS, Take 0.5 tablets (5 mg total) by mouth 4 (four) times daily., Disp: 60 tablet, Rfl: 0   Assessment and Plan: 1. Chronic pain syndrome   2. Chronic, continuous use of opioids   3. DDD (degenerative disc disease), lumbar   4. Facet arthropathy, lumbar  5. Low back derangement syndrome   6. Lumbar post-laminectomy syndrome   7. Facet arthritis of lumbosacral region   8. Chronic fatigue   9. Other forms of scoliosis, lumbar region   Based on our discussion today upon review of the [facility] practitioner database information it is appropriate to keep her on her current medication regimen.  She is doing well with chronic opioid therapy staying active and having good relief and success.  He is a be dated for December 12 and January 11.  No other changes will be initiated today with return to clinic requested in 2 months and continue follow-up with her  primary care physicians for baseline medical care.  30  Follow Up Instructions:    I discussed the assessment and treatment plan with the patient. The patient was provided an opportunity to ask questions and all were answered. The patient agreed with the plan and demonstrated an understanding of the instructions.   The patient was advised to call back or seek an in-person evaluation if the symptoms worsen or if the condition fails to improve as anticipated.  I provided 30 minutes of non-face-to-face time during this encounter.   [name], MD

## 2021-09-15 ENCOUNTER — Ambulatory Visit (INDEPENDENT_AMBULATORY_CARE_PROVIDER_SITE_OTHER): Payer: Medicare Other | Admitting: Internal Medicine

## 2021-09-15 ENCOUNTER — Other Ambulatory Visit: Payer: Self-pay

## 2021-09-15 ENCOUNTER — Encounter: Payer: Self-pay | Admitting: Internal Medicine

## 2021-09-15 VITALS — BP 136/74 | HR 80 | Ht 64.0 in | Wt 135.7 lb

## 2021-09-15 DIAGNOSIS — E7800 Pure hypercholesterolemia, unspecified: Secondary | ICD-10-CM

## 2021-09-15 DIAGNOSIS — M4186 Other forms of scoliosis, lumbar region: Secondary | ICD-10-CM | POA: Diagnosis not present

## 2021-09-15 DIAGNOSIS — R5382 Chronic fatigue, unspecified: Secondary | ICD-10-CM

## 2021-09-15 DIAGNOSIS — R413 Other amnesia: Secondary | ICD-10-CM

## 2021-09-15 NOTE — Progress Notes (Signed)
Established Patient Office Visit  Subjective:  Patient ID: Whitney Rivera, female    DOB: 03/06/1961  Age: 61 y.o. MRN: [id number]  CC:  Chief Complaint  Patient presents with   Follow-up    HPI  [name] presents for general check up Past Medical History:  Diagnosis Date   CHF (congestive heart failure) ([place])    Chronic back pain    Chronic constipation    Chronic fatigue 10/28/2017   Hypertension    Lumbar post-laminectomy syndrome 05/10/2019   Memory difficulty 10/28/2017   Osteoporosis    Scoliosis     Past Surgical History:  Procedure Laterality Date   TUBAL LIGATION      Family History  Problem Relation Age of Onset   Heart disease Mother    Cancer Mother    Cancer Father    Hypertension Brother    Diabetes Brother     Social History   Socioeconomic History   Marital status: Divorced    Spouse name: Not on file   Number of children: Not on file   Years of education: Not on file   Highest education level: Not on file  Occupational History   Not on file  Tobacco Use   Smoking status: Former   Smokeless tobacco: Never  Substance and Sexual Activity   Alcohol use: No   Drug use: No   Sexual activity: Not on file  Other Topics Concern   Not on file  Social History Narrative   Not on file   Social Determinants of Health   Financial Resource Strain: Low Risk    Difficulty of Paying Living Expenses: Not hard at all  Food Insecurity: No Food Insecurity   Worried About Charity fundraiser in the Last Year: Never true   [place] in the Last Year: Never true  Transportation Needs: No Transportation Needs   Lack of Transportation (Medical): No   Lack of Transportation (Non-Medical): No  Physical Activity: Insufficiently Active   Days of Exercise per Week: 2 days   Minutes of Exercise per Session: 20 min  Stress: No Stress Concern Present   Feeling of Stress : Not at all  Social Connections: Socially Isolated   Frequency of Communication  with Friends and Family: Three times a week   Frequency of Social Gatherings with Friends and Family: Three times a week   Attends Religious Services: Never   Active Member of Clubs or Organizations: No   Attends Archivist Meetings: Never   Marital Status: Divorced  Human resources officer Violence: Not At Risk   Fear of Current or Ex-Partner: No   Emotionally Abused: No   Physically Abused: No   Sexually Abused: No     Current Outpatient Medications:    aspirin EC 81 MG tablet, Take 81 mg by mouth daily., Disp: , Rfl:    cyclobenzaprine (FLEXERIL) 5 MG tablet, Take 1 tablet (5 mg total) by mouth daily., Disp: 30 tablet, Rfl: 6   dicyclomine (BENTYL) 10 MG capsule, Take 1 capsule (10 mg total) by mouth 4 (four) times daily -  before meals and at bedtime., Disp: 20 capsule, Rfl: 0   doxycycline (VIBRA-TABS) 100 MG tablet, Take 1 tablet (100 mg total) by mouth 2 (two) times daily., Disp: 20 tablet, Rfl: 0   lisinopril (ZESTRIL) 2.5 MG tablet, Take 1 tablet (2.5 mg total) by mouth daily., Disp: 30 tablet, Rfl: 6   metoprolol tartrate (LOPRESSOR) 25 MG tablet, TAKE 1  TABLET BY MOUTH TWICE (2) DAILY, Disp: 60 tablet, Rfl: 3   Multiple Vitamins-Minerals (MULTIVITAMIN WITH MINERALS) tablet, Take 1 tablet by mouth daily., Disp: , Rfl:    oxaprozin (DAYPRO) 600 MG tablet, TAKE 1 TABLET BY MOUTH ONCE A DAY, Disp: 30 tablet, Rfl: 4   Oxycodone HCl 10 MG TABS, Take 0.5 tablets (5 mg total) by mouth in the morning, at noon, in the evening, and at bedtime., Disp: 60 tablet, Rfl: 0   Oxycodone HCl 10 MG TABS, Take 0.5 tablets (5 mg total) by mouth 4 (four) times daily., Disp: 60 tablet, Rfl: 0   Oxycodone HCl 10 MG TABS, Take 0.5 tablets (5 mg total) by mouth 4 (four) times daily., Disp: 60 tablet, Rfl: 0   Allergies  Allergen Reactions   Ceclor [Cefaclor] Shortness Of Breath, Swelling and Rash   Ciprofloxacin Hives and Itching    ROS Review of Systems  Constitutional: Negative.   HENT:  Negative.    Eyes: Negative.   Respiratory: Negative.    Cardiovascular: Negative.   Gastrointestinal: Negative.   Endocrine: Negative.   Genitourinary: Negative.   Musculoskeletal: Negative.   Skin: Negative.   Allergic/Immunologic: Negative.   Neurological: Negative.   Hematological: Negative.   Psychiatric/Behavioral: Negative.    All other systems reviewed and are negative.    Objective:    Physical Exam Vitals reviewed.  Constitutional:      Appearance: Normal appearance.  HENT:     Mouth/Throat:     Mouth: Mucous membranes are moist.  Eyes:     Pupils: Pupils are equal, round, and reactive to light.  Neck:     Vascular: No carotid bruit.  Cardiovascular:     Rate and Rhythm: Normal rate and regular rhythm.     Pulses: Normal pulses.     Heart sounds: Normal heart sounds.  Pulmonary:     Effort: Pulmonary effort is normal.     Breath sounds: Normal breath sounds.  Abdominal:     General: Bowel sounds are normal.     Palpations: Abdomen is soft. There is no hepatomegaly, splenomegaly or mass.     Tenderness: There is no abdominal tenderness.     Hernia: No hernia is present.  Musculoskeletal:        General: No tenderness.     Cervical back: Neck supple.     Right lower leg: No edema.     Left lower leg: No edema.  Skin:    Findings: No rash.  Neurological:     Mental Status: She is alert and oriented to person, place, and time.     Motor: No weakness.  Psychiatric:        Mood and Affect: Mood and affect normal.        Behavior: Behavior normal.    BP 136/74    Pulse 80    Ht 5' 4" (1.626 m)    Wt 135 lb 11.2 oz (61.6 kg)    BMI 23.29 kg/m  Wt Readings from Last 3 Encounters:  09/15/21 135 lb 11.2 oz (61.6 kg)  06/11/21 134 lb 3.2 oz (60.9 kg)  03/19/21 132 lb 6.4 oz (60.1 kg)     Health Maintenance Due  Topic Date Due   Hepatitis C Screening  Never done   Zoster Vaccines- Shingrix (1 of 2) Never done   PAP SMEAR-Modifier  08/24/2018    COVID-19 Vaccine (3 - Booster for Moderna series) 10/21/2020   INFLUENZA VACCINE  Never done  There are no preventive care reminders to display for this patient.  Lab Results  Component Value Date   TSH 3.26 03/04/2021   Lab Results  Component Value Date   WBC 6.3 03/04/2021   HGB 14.5 03/04/2021   HCT 45.2 (H) 03/04/2021   MCV 89.3 03/04/2021   PLT 227 03/04/2021   Lab Results  Component Value Date   NA 140 03/04/2021   K 4.5 03/04/2021   CO2 21 03/04/2021   GLUCOSE 92 03/04/2021   BUN 14 03/04/2021   CREATININE 0.98 03/04/2021   BILITOT 0.3 03/04/2021   ALKPHOS 68 05/27/2015   AST 20 03/04/2021   ALT 11 03/04/2021   PROT 6.5 03/04/2021   ALBUMIN 4.4 05/27/2015   CALCIUM 9.5 03/04/2021   ANIONGAP 5 05/27/2015   EGFR 66 03/04/2021   Lab Results  Component Value Date   CHOL 230 (H) 03/04/2021   Lab Results  Component Value Date   HDL 49 (L) 03/04/2021   Lab Results  Component Value Date   LDLCALC 149 (H) 03/04/2021   Lab Results  Component Value Date   TRIG 185 (H) 03/04/2021   Lab Results  Component Value Date   CHOLHDL 4.7 03/04/2021   No results found for: HGBA1C    Assessment & Plan:   Problem List Items Addressed This Visit       Musculoskeletal and Integument   Other forms of scoliosis, lumbar region - Primary    Stable on the medication        Other   Memory difficulty    No further deterioration,      Chronic fatigue    Chronic fatigue is stable,      Pure hypercholesterolemia    Hypercholesterolemia  I advised the patient to follow Mediterranean diet This diet is rich in fruits vegetables and whole grain, and This diet is also rich in fish and lean meat Patient should also eat a handful of almonds or walnuts daily Recent heart study indicated that average follow-up on this kind of diet reduces the cardiovascular mortality by 50 to 70%==       No orders of the defined types were placed in this  encounter.   Follow-up: No follow-ups on file.    Cletis Athens, MD

## 2021-09-15 NOTE — Assessment & Plan Note (Signed)
Chronic fatigue is stable,

## 2021-09-15 NOTE — Assessment & Plan Note (Signed)
Hypercholesterolemia  I advised the patient to follow Mediterranean diet This diet is rich in fruits vegetables and whole grain, and This diet is also rich in fish and lean meat Patient should also eat a handful of almonds or walnuts daily Recent heart study indicated that average follow-up on this kind of diet reduces the cardiovascular mortality by 50 to 70%== 

## 2021-09-15 NOTE — Assessment & Plan Note (Signed)
No further deterioration,

## 2021-09-15 NOTE — Assessment & Plan Note (Signed)
Stable on the medication 

## 2021-09-23 ENCOUNTER — Other Ambulatory Visit: Payer: Self-pay

## 2021-09-23 ENCOUNTER — Encounter: Payer: Self-pay | Admitting: Anesthesiology

## 2021-09-23 ENCOUNTER — Ambulatory Visit: Payer: Medicare Other | Attending: Anesthesiology | Admitting: Anesthesiology

## 2021-09-23 DIAGNOSIS — M47816 Spondylosis without myelopathy or radiculopathy, lumbar region: Secondary | ICD-10-CM

## 2021-09-23 DIAGNOSIS — M961 Postlaminectomy syndrome, not elsewhere classified: Secondary | ICD-10-CM

## 2021-09-23 DIAGNOSIS — M5136 Other intervertebral disc degeneration, lumbar region: Secondary | ICD-10-CM

## 2021-09-23 DIAGNOSIS — G894 Chronic pain syndrome: Secondary | ICD-10-CM | POA: Diagnosis not present

## 2021-09-23 DIAGNOSIS — M4186 Other forms of scoliosis, lumbar region: Secondary | ICD-10-CM

## 2021-09-23 DIAGNOSIS — M5386 Other specified dorsopathies, lumbar region: Secondary | ICD-10-CM | POA: Diagnosis not present

## 2021-09-23 DIAGNOSIS — R5382 Chronic fatigue, unspecified: Secondary | ICD-10-CM | POA: Diagnosis not present

## 2021-09-23 DIAGNOSIS — M51369 Other intervertebral disc degeneration, lumbar region without mention of lumbar back pain or lower extremity pain: Secondary | ICD-10-CM

## 2021-09-23 DIAGNOSIS — F1190 Opioid use, unspecified, uncomplicated: Secondary | ICD-10-CM | POA: Diagnosis not present

## 2021-09-23 DIAGNOSIS — M47817 Spondylosis without myelopathy or radiculopathy, lumbosacral region: Secondary | ICD-10-CM

## 2021-09-23 MED ORDER — OXYCODONE HCL 10 MG PO TABS: 5.0000 mg | ORAL_TABLET | Freq: Four times a day (QID) | ORAL | 0 refills | Status: DC

## 2021-09-23 NOTE — Progress Notes (Signed)
Virtual Visit via Telephone Note  I connected with Whitney Rivera on 09/23/21 at  1:00 PM EST by telephone and verified that I am speaking with the correct person using two identifiers.  Location: Patient: Home Provider: Pain control center   I discussed the limitations, risks, security and privacy concerns of performing an evaluation and management service by telephone and the availability of in person appointments. I also discussed with the patient that there may be a patient responsible charge related to this service. The patient expressed understanding and agreed to proceed.   History of Present Illness: I spoke with Whitney Rivera today via telephone.  She was unable to link for the video portion of the virtual conference and she reports that she is trying to stay active walking 1 mile 3 times in 1 day and then walking again in the third day.  She is having significant problem with her low back but this has been stable without change.  The quality characteristic distribution of her low back and leg pain has been stable.  She is taking her medications with a 10 mg oxycodone and using one half of a tablet approximately every 4-6 hours for total dose of 20 mg/day.  This continues to give her good relief rated about 70% lasting about 4 to 6 hours before she has recurrence of her baseline pain.  Otherwise she is in her usual state of health.  No changes in sensorimotor function or bowel or bladder function are noted.  She has been making some modifications with her high blood pressure medications to help with some intermittent confusion she has reported in the past.  She does not attribute this to any timing of her opioids.  Review of systems: General: No fevers or chills Pulmonary: No shortness of breath or dyspnea Cardiac: No angina or palpitations or lightheadedness GI: No abdominal pain or constipation Psych: No depression    O.mbservations/Objective:   Current Outpatient Medications:    aspirin EC  81 MG tablet, Take 81 mg by mouth daily., Disp: , Rfl:    cyclobenzaprine (FLEXERIL) 5 MG tablet, Take 1 tablet (5 mg total) by mouth daily., Disp: 30 tablet, Rfl: 6   dicyclomine (BENTYL) 10 MG capsule, Take 1 capsule (10 mg total) by mouth 4 (four) times daily -  before meals and at bedtime., Disp: 20 capsule, Rfl: 0   doxycycline (VIBRA-TABS) 100 MG tablet, Take 1 tablet (100 mg total) by mouth 2 (two) times daily., Disp: 20 tablet, Rfl: 0   lisinopril (ZESTRIL) 2.5 MG tablet, Take 1 tablet (2.5 mg total) by mouth daily., Disp: 30 tablet, Rfl: 6   metoprolol tartrate (LOPRESSOR) 25 MG tablet, TAKE 1 TABLET BY MOUTH TWICE (2) DAILY, Disp: 60 tablet, Rfl: 3   Multiple Vitamins-Minerals (MULTIVITAMIN WITH MINERALS) tablet, Take 1 tablet by mouth daily., Disp: , Rfl:    oxaprozin (DAYPRO) 600 MG tablet, TAKE 1 TABLET BY MOUTH ONCE A DAY, Disp: 30 tablet, Rfl: 4   Oxycodone HCl 10 MG TABS, Take 0.5 tablets (5 mg total) by mouth 4 (four) times daily., Disp: 60 tablet, Rfl: 0   [START ON 10/03/2021] Oxycodone HCl 10 MG TABS, Take 0.5 tablets (5 mg total) by mouth in the morning, at noon, in the evening, and at bedtime., Disp: 60 tablet, Rfl: 0   [START ON 11/02/2021] Oxycodone HCl 10 MG TABS, Take 0.5 tablets (5 mg total) by mouth 4 (four) times daily., Disp: 60 tablet, Rfl: 0   Past Medical History:  Diagnosis  Date   CHF (congestive heart failure) (HCC)    Chronic back pain    Chronic constipation    Chronic fatigue 10/28/2017   Hypertension    Lumbar post-laminectomy syndrome 05/10/2019   Memory difficulty 10/28/2017   Osteoporosis    Scoliosis     Assessment and Plan:  1. Chronic pain syndrome   2. Chronic, continuous use of opioids   3. DDD (degenerative disc disease), lumbar   4. Facet arthropathy, lumbar   5. Low back derangement syndrome   6. Lumbar post-laminectomy syndrome   7. Facet arthritis of lumbosacral region   8. Chronic fatigue   9. Other forms of scoliosis, lumbar region     I reviewed your start backBased on our discussion today I think is appropriate refill her medicines for the next 2 months.  She will be scheduled for a routine evaluation and urine screen for routine management.  No other changes in her pharmacologic regimen will be initiated.  I have encouraged her to continue with stretching strengthening exercises and given her symptomatology outlined some ideas for mixing of her exercise protocol.  Continue with core strengthening and continue follow-up with her primary care physicians for baseline medical care.  Plan for return to clinic in 2 months Follow Up Instructions:    I discussed the assessment and treatment plan with the patient. The patient was provided an opportunity to ask questions and all were answered. The patient agreed with the plan and demonstrated an understanding of the instructions.   The patient was advised to call back or seek an in-person evaluation if the symptoms worsen or if the condition fails to improve as anticipated.  I provided 30 minutes of non-face-to-face time during this encounter.   [name], MD

## 2021-11-24 ENCOUNTER — Encounter: Payer: Self-pay | Admitting: Anesthesiology

## 2021-11-24 ENCOUNTER — Ambulatory Visit: Payer: Medicare Other | Attending: Anesthesiology | Admitting: Anesthesiology

## 2021-11-24 VITALS — BP 134/84 | HR 93 | Temp 98.7°F | Resp 18 | Ht 64.0 in | Wt 134.0 lb

## 2021-11-24 DIAGNOSIS — G894 Chronic pain syndrome: Secondary | ICD-10-CM | POA: Diagnosis not present

## 2021-11-24 DIAGNOSIS — M47816 Spondylosis without myelopathy or radiculopathy, lumbar region: Secondary | ICD-10-CM | POA: Insufficient documentation

## 2021-11-24 DIAGNOSIS — M961 Postlaminectomy syndrome, not elsewhere classified: Secondary | ICD-10-CM | POA: Insufficient documentation

## 2021-11-24 DIAGNOSIS — M5136 Other intervertebral disc degeneration, lumbar region: Secondary | ICD-10-CM | POA: Diagnosis not present

## 2021-11-24 DIAGNOSIS — M5386 Other specified dorsopathies, lumbar region: Secondary | ICD-10-CM | POA: Insufficient documentation

## 2021-11-24 DIAGNOSIS — M47817 Spondylosis without myelopathy or radiculopathy, lumbosacral region: Secondary | ICD-10-CM | POA: Insufficient documentation

## 2021-11-24 DIAGNOSIS — F1190 Opioid use, unspecified, uncomplicated: Secondary | ICD-10-CM | POA: Diagnosis present

## 2021-11-24 DIAGNOSIS — R5382 Chronic fatigue, unspecified: Secondary | ICD-10-CM | POA: Diagnosis not present

## 2021-11-24 MED ORDER — CYCLOBENZAPRINE HCL 5 MG PO TABS: 5.0000 mg | ORAL_TABLET | Freq: Every day | ORAL | 3 refills | Status: AC

## 2021-11-24 MED ORDER — OXYCODONE HCL 10 MG PO TABS: 5.0000 mg | ORAL_TABLET | Freq: Four times a day (QID) | ORAL | 0 refills | Status: DC

## 2021-11-24 NOTE — Progress Notes (Signed)
Nursing Pain Medication Assessment:  ?Safety precautions to be maintained throughout the outpatient stay will include: orient to surroundings, keep bed in low position, maintain call bell within reach at all times, provide assistance with transfer out of bed and ambulation.  ?Medication Inspection Compliance: Pill count conducted under aseptic conditions, in front of the patient. Neither the pills nor the bottle was removed from the patient's sight at any time. Once count was completed pills were immediately returned to the patient in their original bottle. ? ?Medication: Oxycodone IR ?Pill/Patch Count:  60 of 60 pills remain ?Pill/Patch Appearance: Markings consistent with prescribed medication ?Bottle Appearance: Standard pharmacy container. Clearly labeled. ?Filled Date: 03 / 13 / 2023 ?Last Medication intake:  Today ?Oxycodone ?9/60 ?Filled 10-03-2021  ?last took today ?

## 2021-11-25 ENCOUNTER — Encounter: Payer: Medicare Other | Admitting: Anesthesiology

## 2021-11-25 NOTE — Progress Notes (Signed)
? ?Subjective:  ?Patient ID: Whitney Rivera, female    DOB: 11-29-1960  Age: 61 y.o. MRN: [id number] ? ?CC: Back Pain ? ? ?Procedure: None ? ?HPI ?Whitney Rivera presents for reevaluation.  She continues to have low back pain and leg pain comparable to what she is experienced historically.  She has been on her chronic opioid management for a considerable time and continues to respond favorably to this without side effect.  She continues to get good relief with her medication rated about 75 to 80%.  She generally takes 5 mg of her oxycodone at a time and does this about every 4-6 hours.  She has breakthrough at the 4 to 6-hour mark and unfortunately no other therapy has been as effective for her.  The current medication regimen enables her to continue to function actively and independently.  She is sleeping better at night with her medicines.  Otherwise she reports that she is in her usual state of health with no new contributory changes today.  The quality characteristic of the pain are without change and no other changes in lower extremity strength or function are noted. ? ?Outpatient Medications Prior to Visit  ?Medication Sig Dispense Refill  ? aspirin EC 81 MG tablet Take 81 mg by mouth daily.    ? dicyclomine (BENTYL) 10 MG capsule Take 1 capsule (10 mg total) by mouth 4 (four) times daily -  before meals and at bedtime. 20 capsule 0  ? doxycycline (VIBRA-TABS) 100 MG tablet Take 1 tablet (100 mg total) by mouth 2 (two) times daily. 20 tablet 0  ? lisinopril (ZESTRIL) 2.5 MG tablet Take 1 tablet (2.5 mg total) by mouth daily. 30 tablet 6  ? metoprolol tartrate (LOPRESSOR) 25 MG tablet TAKE 1 TABLET BY MOUTH TWICE (2) DAILY 60 tablet 3  ? Multiple Vitamins-Minerals (MULTIVITAMIN WITH MINERALS) tablet Take 1 tablet by mouth daily.    ? oxaprozin (DAYPRO) 600 MG tablet TAKE 1 TABLET BY MOUTH ONCE A DAY 30 tablet 4  ? Oxycodone HCl 10 MG TABS Take 0.5 tablets (5 mg total) by mouth 4 (four) times daily. 60 tablet 0  ?  Oxycodone HCl 10 MG TABS Take 0.5 tablets (5 mg total) by mouth in the morning, at noon, in the evening, and at bedtime. 60 tablet 0  ? cyclobenzaprine (FLEXERIL) 5 MG tablet Take 1 tablet (5 mg total) by mouth daily. 30 tablet 6  ? Oxycodone HCl 10 MG TABS Take 0.5 tablets (5 mg total) by mouth 4 (four) times daily. 60 tablet 0  ? ?No facility-administered medications prior to visit.  ? ? ?Review of Systems ?CNS: No confusion or sedation ?Cardiac: No angina or palpitations ?GI: No abdominal pain or constipation ?Constitutional: No nausea vomiting fevers or chills ? ?Objective:  ?BP 134/84   Pulse 93   Temp 98.7 ?F (37.1 ?C)   Resp 18   Ht 5\' 4"  (1.626 m)   Wt 134 lb (60.8 kg)   SpO2 98%   BMI 23.00 kg/m?  ? ? ?BP Readings from Last 3 Encounters:  ?11/24/21 134/84  ?09/15/21 136/74  ?06/11/21 134/74  ? ? ? ?Wt Readings from Last 3 Encounters:  ?11/24/21 134 lb (60.8 kg)  ?09/15/21 135 lb 11.2 oz (61.6 kg)  ?06/11/21 134 lb 3.2 oz (60.9 kg)  ? ? ? ?Physical Exam ?Pt is alert and oriented ?PERRL EOMI ?HEART IS RRR no murmur or rub ?LCTA no wheezing or rales ?MUSCULOSKELETAL reveals some paraspinous muscle tenderness in the  thoracic and lumbar region but no trigger points.  She ambulates with an antalgic gait and muscle tone and bulk is at baseline. ? ?Labs ? ?No results found for: HGBA1C ?Lab Results  ?Component Value Date  ? LDLCALC 149 (H) 03/04/2021  ? CREATININE 0.98 03/04/2021  ? ? ?-------------------------------------------------------------------------------------------------------------------- ?Lab Results  ?Component Value Date  ? WBC 6.3 03/04/2021  ? HGB 14.5 03/04/2021  ? HCT 45.2 (H) 03/04/2021  ? PLT 227 03/04/2021  ? GLUCOSE 92 03/04/2021  ? CHOL 230 (H) 03/04/2021  ? TRIG 185 (H) 03/04/2021  ? HDL 49 (L) 03/04/2021  ? LDLCALC 149 (H) 03/04/2021  ? ALT 11 03/04/2021  ? AST 20 03/04/2021  ? NA 140 03/04/2021  ? K 4.5 03/04/2021  ? CL 106 03/04/2021  ? CREATININE 0.98 03/04/2021  ? BUN 14  03/04/2021  ? CO2 21 03/04/2021  ? TSH 3.26 03/04/2021  ? ? ?--------------------------------------------------------------------------------------------------------------------- ?No results found. ? ? ?Assessment & Plan:  ? ?Whitney Rivera was seen today for back pain. ? ?Diagnoses and all orders for this visit: ? ?Chronic pain syndrome ? ?Chronic, continuous use of opioids ? ?DDD (degenerative disc disease), lumbar ? ?Facet arthropathy, lumbar ? ?Low back derangement syndrome ? ?Lumbar post-laminectomy syndrome ? ?Facet arthritis of lumbosacral region ? ?Chronic fatigue ? ?Other orders ?-     Oxycodone HCl 10 MG TABS; Take 0.5 tablets (5 mg total) by mouth 4 (four) times daily. ?-     Oxycodone HCl 10 MG TABS; Take 0.5 tablets (5 mg total) by mouth in the morning, at noon, in the evening, and at bedtime. ?-     cyclobenzaprine (FLEXERIL) 5 MG tablet; Take 1 tablet (5 mg total) by mouth daily. ? ? ?   ? ? ?---------------------------------------------------------------------------------------------------------------------- ? ?Problem List Items Addressed This Visit   ? ?  ? Unprioritized  ? Chronic fatigue  ? Chronic pain syndrome - Primary  ? Relevant Medications  ? Oxycodone HCl 10 MG TABS (Start on 12/22/2021)  ? Oxycodone HCl 10 MG TABS (Start on 02/05/2022)  ? cyclobenzaprine (FLEXERIL) 5 MG tablet  ? Chronic, continuous use of opioids  ? DDD (degenerative disc disease), lumbar  ? Relevant Medications  ? Oxycodone HCl 10 MG TABS (Start on 12/22/2021)  ? Oxycodone HCl 10 MG TABS (Start on 02/05/2022)  ? cyclobenzaprine (FLEXERIL) 5 MG tablet  ? Facet arthropathy, lumbar  ? Low back derangement syndrome  ? Relevant Medications  ? Oxycodone HCl 10 MG TABS (Start on 12/22/2021)  ? Oxycodone HCl 10 MG TABS (Start on 02/05/2022)  ? cyclobenzaprine (FLEXERIL) 5 MG tablet  ? Lumbar post-laminectomy syndrome  ? ?Other Visit Diagnoses   ? ? Facet arthritis of lumbosacral region      ? Relevant Medications  ? Oxycodone HCl 10 MG TABS  (Start on 12/22/2021)  ? Oxycodone HCl 10 MG TABS (Start on 02/05/2022)  ? cyclobenzaprine (FLEXERIL) 5 MG tablet  ? ?  ? ? ? ? ?---------------------------------------------------------------------------------------------------------------------- ? ?1. Chronic pain syndrome ?Continue current management.  No other changes are initiated today.  I have reviewed the [facility] practitioner database information and it is appropriate for refill.  These were dated from May 1 and June 15.  She is averaging about 30 to 45 days for her current regimen.  We will schedule her for return to clinic in 2 months. ? ?2. Chronic, continuous use of opioids ?As above ? ?3. DDD (degenerative disc disease), lumbar ?Continue core stretching strengthening exercises as reviewed today. ? ?  4. Facet arthropathy, lumbar ?As above ? ?5. Low back derangement syndrome ? ? ?6. Lumbar post-laminectomy syndrome ? ? ?7. Facet arthritis of lumbosacral region ?We will have her restart her Naprosyn 220 twice daily and she can initiate the Flexeril which has worked for her historically as well. ? ?8. Chronic fatigue ?Continue follow-up with her primary care physicians for baseline medical care.  We have had a long and extensive discussion about this and currently based on our work together I do not feel that the fatigue is directly related to the opioids.  She does not associate any time relationship with her opioid dosing. ? ? ? ?---------------------------------------------------------------------------------------------------------------------- ? ?I am having Whitney Rivera start on Oxycodone HCl. I am also having her maintain her aspirin EC, multivitamin with minerals, dicyclomine, doxycycline, oxaprozin, metoprolol tartrate, lisinopril, Oxycodone HCl, Oxycodone HCl, Oxycodone HCl, and cyclobenzaprine. ? ? ?Meds ordered this encounter  ?Medications  ? Oxycodone HCl 10 MG TABS  ?  Sig: Take 0.5 tablets (5 mg total) by mouth 4 (four) times daily.  ?   Dispense:  60 tablet  ?  Refill:  0  ? Oxycodone HCl 10 MG TABS  ?  Sig: Take 0.5 tablets (5 mg total) by mouth in the morning, at noon, in the evening, and at bedtime.  ?  Dispense:  60 tablet  ?  Refill:  0  ? cyclobenzap

## 2021-12-15 ENCOUNTER — Ambulatory Visit: Payer: Medicare Other | Admitting: Internal Medicine

## 2022-01-02 ENCOUNTER — Other Ambulatory Visit: Payer: Self-pay | Admitting: Internal Medicine

## 2022-01-15 ENCOUNTER — Other Ambulatory Visit: Payer: Self-pay | Admitting: Internal Medicine

## 2022-02-26 ENCOUNTER — Encounter: Payer: Self-pay | Admitting: Anesthesiology

## 2022-02-26 ENCOUNTER — Ambulatory Visit: Payer: Medicare Other | Attending: Anesthesiology | Admitting: Anesthesiology

## 2022-02-26 DIAGNOSIS — M47816 Spondylosis without myelopathy or radiculopathy, lumbar region: Secondary | ICD-10-CM

## 2022-02-26 DIAGNOSIS — M5386 Other specified dorsopathies, lumbar region: Secondary | ICD-10-CM | POA: Diagnosis not present

## 2022-02-26 DIAGNOSIS — M47817 Spondylosis without myelopathy or radiculopathy, lumbosacral region: Secondary | ICD-10-CM

## 2022-02-26 DIAGNOSIS — G894 Chronic pain syndrome: Secondary | ICD-10-CM | POA: Diagnosis not present

## 2022-02-26 DIAGNOSIS — F1190 Opioid use, unspecified, uncomplicated: Secondary | ICD-10-CM

## 2022-02-26 DIAGNOSIS — M961 Postlaminectomy syndrome, not elsewhere classified: Secondary | ICD-10-CM | POA: Diagnosis not present

## 2022-02-26 DIAGNOSIS — M5136 Other intervertebral disc degeneration, lumbar region: Secondary | ICD-10-CM | POA: Diagnosis not present

## 2022-02-26 DIAGNOSIS — R5382 Chronic fatigue, unspecified: Secondary | ICD-10-CM

## 2022-02-26 MED ORDER — OXYCODONE HCL 10 MG PO TABS: 5.0000 mg | ORAL_TABLET | Freq: Four times a day (QID) | ORAL | 0 refills | Status: DC

## 2022-02-27 NOTE — Progress Notes (Signed)
Virtual Visit via Telephone Note  I connected with Kynadie Girdner on 02/27/22 at  2:00 PM EDT by telephone and verified that I am speaking with the correct person using two identifiers.  Location: Patient: Home Provider: Pain control center   I discussed the limitations, risks, security and privacy concerns of performing an evaluation and management service by telephone and the availability of in person appointments. I also discussed with the patient that there may be a patient responsible charge related to this service. The patient expressed understanding and agreed to proceed.   History of Present Illness:  I spoke with [name] via telephone today as we are unable to do the video portion of the conference.  She reports that she is done very well with her current regimen.  She presently takes 5 mg of oxycodone about 2-3 times a day on average but on a bad day she may go up to 4 times a day for severe pain.  She is trying to stay active and doing stretching exercises walking and otherwise continues to perform her daily activities at home.  The pain is gnawing aching and can be extraordinary for her as reported involving the low back hips buttocks and lower legs.  Unfortunately more conservative therapy has failed her but the oxycodone 5 mg dosing seems to work for about 4 to 6 hours and knocks out about 75% of her pain and allows her to continue to stay active whereas she has failed more conservative therapy.  No change in the quality characteristic or distribution of her lower extremity pain or back pain is noted.  Review of systems: General: No fevers or chills Pulmonary: No shortness of breath or dyspnea Cardiac: No angina or palpitations or lightheadedness GI: No abdominal pain or constipation Psych: No depression  Observations/Objective:  Current Outpatient Medications:    aspirin EC 81 MG tablet, Take 81 mg by mouth daily., Disp: , Rfl:    dicyclomine (BENTYL) 10 MG capsule, Take 1 capsule  (10 mg total) by mouth 4 (four) times daily -  before meals and at bedtime., Disp: 20 capsule, Rfl: 0   doxycycline (VIBRA-TABS) 100 MG tablet, Take 1 tablet (100 mg total) by mouth 2 (two) times daily., Disp: 20 tablet, Rfl: 0   lisinopril (ZESTRIL) 2.5 MG tablet, TAKE 1 TABLET BY MOUTH ONCE A DAY, Disp: 30 tablet, Rfl: 6   metoprolol tartrate (LOPRESSOR) 25 MG tablet, TAKE 1 TABLET BY MOUTH TWICE A DAY, Disp: 60 tablet, Rfl: 3   Multiple Vitamins-Minerals (MULTIVITAMIN WITH MINERALS) tablet, Take 1 tablet by mouth daily., Disp: , Rfl:    oxaprozin (DAYPRO) 600 MG tablet, TAKE 1 TABLET BY MOUTH ONCE A DAY, Disp: 30 tablet, Rfl: 4   Oxycodone HCl 10 MG TABS, Take 0.5 tablets (5 mg total) by mouth in the morning, at noon, in the evening, and at bedtime., Disp: 60 tablet, Rfl: 0   Oxycodone HCl 10 MG TABS, Take 0.5 tablets (5 mg total) by mouth 4 (four) times daily., Disp: 60 tablet, Rfl: 0   [START ON 03/07/2022] Oxycodone HCl 10 MG TABS, Take 0.5 tablets (5 mg total) by mouth in the morning, at noon, in the evening, and at bedtime., Disp: 60 tablet, Rfl: 0   [START ON 04/06/2022] Oxycodone HCl 10 MG TABS, Take 0.5 tablets (5 mg total) by mouth 4 (four) times daily., Disp: 60 tablet, Rfl: 0   Past Medical History:  Diagnosis Date   CHF (congestive heart failure) (HCC)  Chronic back pain    Chronic constipation    Chronic fatigue 10/28/2017   Hypertension    Lumbar post-laminectomy syndrome 05/10/2019   Memory difficulty 10/28/2017   Osteoporosis    Scoliosis      Assessment and Plan: 1. Chronic pain syndrome   2. Chronic, continuous use of opioids   3. DDD (degenerative disc disease), lumbar   4. Facet arthropathy, lumbar   5. Low back derangement syndrome   6. Lumbar post-laminectomy syndrome   7. Facet arthritis of lumbosacral region   8. Chronic fatigue   Based on our discussion today and upon review of the [facility] practitioner database information I think is appropriate to  refill her medicines for the next 2 months.  Is a be dated for July 15 and August 14.  No other changes in her pharmacologic regimen will be initiated.  I encouraged her to continue with stretching strengthening exercises and daily activities.  Continue follow-up with her primary care physicians for baseline medical care with return to clinic scheduled in 2 months.  Follow Up Instructions:    I discussed the assessment and treatment plan with the patient. The patient was provided an opportunity to ask questions and all were answered. The patient agreed with the plan and demonstrated an understanding of the instructions.   The patient was advised to call back or seek an in-person evaluation if the symptoms worsen or if the condition fails to improve as anticipated.  I provided 30 minutes of non-face-to-face time during this encounter.   [name], MD

## 2022-05-02 ENCOUNTER — Other Ambulatory Visit: Payer: Self-pay | Admitting: *Deleted

## 2022-05-02 DIAGNOSIS — Z1231 Encounter for screening mammogram for malignant neoplasm of breast: Secondary | ICD-10-CM

## 2022-05-11 ENCOUNTER — Ambulatory Visit: Payer: Medicare Other | Admitting: Anesthesiology

## 2022-05-12 ENCOUNTER — Ambulatory Visit: Payer: Medicare Other | Attending: Anesthesiology | Admitting: Anesthesiology

## 2022-05-12 ENCOUNTER — Encounter: Payer: Self-pay | Admitting: Anesthesiology

## 2022-05-12 DIAGNOSIS — M47816 Spondylosis without myelopathy or radiculopathy, lumbar region: Secondary | ICD-10-CM

## 2022-05-12 DIAGNOSIS — M5136 Other intervertebral disc degeneration, lumbar region: Secondary | ICD-10-CM

## 2022-05-12 DIAGNOSIS — M5386 Other specified dorsopathies, lumbar region: Secondary | ICD-10-CM | POA: Diagnosis not present

## 2022-05-12 DIAGNOSIS — Z79891 Long term (current) use of opiate analgesic: Secondary | ICD-10-CM

## 2022-05-12 DIAGNOSIS — M47817 Spondylosis without myelopathy or radiculopathy, lumbosacral region: Secondary | ICD-10-CM

## 2022-05-12 DIAGNOSIS — F1190 Opioid use, unspecified, uncomplicated: Secondary | ICD-10-CM

## 2022-05-12 DIAGNOSIS — M961 Postlaminectomy syndrome, not elsewhere classified: Secondary | ICD-10-CM | POA: Diagnosis not present

## 2022-05-12 DIAGNOSIS — G894 Chronic pain syndrome: Secondary | ICD-10-CM | POA: Diagnosis not present

## 2022-05-12 MED ORDER — OXYCODONE HCL 10 MG PO TABS: 5.0000 mg | ORAL_TABLET | Freq: Two times a day (BID) | ORAL | 0 refills | Status: DC | PRN

## 2022-05-12 MED ORDER — OXYCODONE HCL 10 MG PO TABS: 5.0000 mg | ORAL_TABLET | Freq: Four times a day (QID) | ORAL | 0 refills | Status: DC | PRN

## 2022-05-12 NOTE — Progress Notes (Signed)
Virtual Visit via Telephone Note  I connected with Whitney Rivera on 05/12/22 at  3:00 PM EDT by telephone and verified that I am speaking with the correct person using two identifiers.  Location: Patient: Home Provider: Pain control center   I discussed the limitations, risks, security and privacy concerns of performing an evaluation and management service by telephone and the availability of in person appointments. I also discussed with the patient that there may be a patient responsible charge related to this service. The patient expressed understanding and agreed to proceed.   History of Present Illness: I spoke with Whitney Rivera via telephone as we were unable to connect for the video portion of the conference.  She reports that she is doing reasonably well and the pain is stable.  No change in quality characteristic or distribution of the pain is noted.  She is taking her medications and trying to wean these as best she can as she feels that she does feel a little bit more active and energetic when she is not taking the full dose.  She is down to approximately 15 mg/day.  She generally does this with 5 mg 3 times a day at present.  Otherwise she is trying to stay active doing her exercises with no other recent changes noted.  Lower extremity strength function bowel bladder function is stable.  Review of systems: General: No fevers or chills Pulmonary: No shortness of breath or dyspnea Cardiac: No angina or palpitations or lightheadedness GI: No abdominal pain or constipation Psych: No depression    Observations/Objective:   Current Outpatient Medications:    aspirin EC 81 MG tablet, Take 81 mg by mouth daily., Disp: , Rfl:    dicyclomine (BENTYL) 10 MG capsule, Take 1 capsule (10 mg total) by mouth 4 (four) times daily -  before meals and at bedtime., Disp: 20 capsule, Rfl: 0   doxycycline (VIBRA-TABS) 100 MG tablet, Take 1 tablet (100 mg total) by mouth 2 (two) times daily., Disp: 20 tablet,  Rfl: 0   lisinopril (ZESTRIL) 2.5 MG tablet, TAKE 1 TABLET BY MOUTH ONCE A DAY, Disp: 30 tablet, Rfl: 6   metoprolol tartrate (LOPRESSOR) 25 MG tablet, TAKE 1 TABLET BY MOUTH TWICE A DAY, Disp: 60 tablet, Rfl: 3   Multiple Vitamins-Minerals (MULTIVITAMIN WITH MINERALS) tablet, Take 1 tablet by mouth daily., Disp: , Rfl:    oxaprozin (DAYPRO) 600 MG tablet, TAKE 1 TABLET BY MOUTH ONCE A DAY, Disp: 30 tablet, Rfl: 4   Oxycodone HCl 10 MG TABS, Take 0.5 tablets (5 mg total) by mouth 4 (four) times daily as needed., Disp: 45 tablet, Rfl: 0   [START ON 06/11/2022] Oxycodone HCl 10 MG TABS, Take 0.5 tablets (5 mg total) by mouth 2 (two) times daily as needed., Disp: 45 tablet, Rfl: 0   Past Medical History:  Diagnosis Date   CHF (congestive heart failure) (HCC)    Chronic back pain    Chronic constipation    Chronic fatigue 10/28/2017   Hypertension    Lumbar post-laminectomy syndrome 05/10/2019   Memory difficulty 10/28/2017   Osteoporosis    Scoliosis     Assessment and Plan: 1. Chronic pain syndrome   2. Chronic, continuous use of opioids   3. DDD (degenerative disc disease), lumbar   4. Facet arthropathy, lumbar   5. Low back derangement syndrome   6. Lumbar post-laminectomy syndrome   7. Facet arthritis of lumbosacral region   Based on our discussion and after review of the  [facility] practitioner database information I think it is appropriate to refill her medicines.  I am going to wean her back to 45 tablets/month down from 60 tablets/month.  We will see how she tolerates this over the course of the next 2 months.  No other changes in her pharmacologic regimen will be initiated.  We have talked about some exercises to help with core strengthening and she is currently working on these.  Continue follow-up with her primary care physicians for baseline medical care with return to clinic scheduled at the 78-month mark.  Follow Up Instructions:    I discussed the assessment and treatment  plan with the patient. The patient was provided an opportunity to ask questions and all were answered. The patient agreed with the plan and demonstrated an understanding of the instructions.   The patient was advised to call back or seek an in-person evaluation if the symptoms worsen or if the condition fails to improve as anticipated.  I provided 30 minutes of non-face-to-face time during this encounter.   [name], MD

## 2022-05-15 ENCOUNTER — Other Ambulatory Visit: Payer: Self-pay | Admitting: Internal Medicine

## 2022-05-27 DIAGNOSIS — G894 Chronic pain syndrome: Secondary | ICD-10-CM | POA: Diagnosis not present

## 2022-05-29 LAB — TOXASSURE SELECT 13 (MW), URINE

## 2022-06-18 ENCOUNTER — Other Ambulatory Visit: Payer: Self-pay | Admitting: Internal Medicine

## 2022-07-01 ENCOUNTER — Encounter: Payer: Self-pay | Admitting: Anesthesiology

## 2022-07-01 ENCOUNTER — Ambulatory Visit: Payer: Medicare Other | Attending: Anesthesiology | Admitting: Anesthesiology

## 2022-07-01 DIAGNOSIS — M961 Postlaminectomy syndrome, not elsewhere classified: Secondary | ICD-10-CM

## 2022-07-01 DIAGNOSIS — M47816 Spondylosis without myelopathy or radiculopathy, lumbar region: Secondary | ICD-10-CM

## 2022-07-01 DIAGNOSIS — M47817 Spondylosis without myelopathy or radiculopathy, lumbosacral region: Secondary | ICD-10-CM

## 2022-07-01 DIAGNOSIS — R5382 Chronic fatigue, unspecified: Secondary | ICD-10-CM | POA: Diagnosis not present

## 2022-07-01 DIAGNOSIS — M5136 Other intervertebral disc degeneration, lumbar region: Secondary | ICD-10-CM | POA: Diagnosis not present

## 2022-07-01 DIAGNOSIS — F1190 Opioid use, unspecified, uncomplicated: Secondary | ICD-10-CM

## 2022-07-01 DIAGNOSIS — G894 Chronic pain syndrome: Secondary | ICD-10-CM

## 2022-07-01 DIAGNOSIS — Z79891 Long term (current) use of opiate analgesic: Secondary | ICD-10-CM | POA: Diagnosis not present

## 2022-07-01 MED ORDER — OXYCODONE HCL 10 MG PO TABS: 5.0000 mg | ORAL_TABLET | Freq: Four times a day (QID) | ORAL | 0 refills | Status: DC | PRN

## 2022-07-01 MED ORDER — OXYCODONE HCL 10 MG PO TABS: 5.0000 mg | ORAL_TABLET | ORAL | 0 refills | Status: DC | PRN

## 2022-07-01 NOTE — Progress Notes (Signed)
Virtual Visit via Telephone Note  I connected with Whitney Rivera on 07/01/22 at  2:40 PM EST by telephone and verified that I am speaking with the correct person using two identifiers.  Location: Patient: Home Provider: [facility] control center   I discussed the limitations, risks, security and privacy concerns of performing an evaluation and management service by telephone and the availability of in person appointments. I also discussed with the patient that there may be a patient responsible charge related to this service. The patient expressed understanding and agreed to proceed.   History of Present Illness:  I spoke with Whitney Rivera via telephone as we are unable to link for the video portion of the conference.  She states she still having a lot of inflammatory symptoms in the low back legs hips knees buttocks and occasionally in her shoulders.  She takes her cherry juice on a daily basis and has been using some ibuprofen 200 mg 2 tablets 2 or 3 times a day with some success.  She still takes her oxycodone half tablet about twice a day sometimes 3 times a day.  This works very well for her and is without side effects and generally gives her excellent pain relief.  She has been doing this for an extended period of time and this is required secondary to the persistence and intractable nature of the pain.  No change in quality characteristic or distribution of symptoms is noted.  No change in lower extremity strength or function is noted.  She is continue to ambulate and try and stay active but this does aggravate her pain.  Review of systems: General: No fevers or chills Pulmonary: No shortness of breath or dyspnea Cardiac: No angina or palpitations or lightheadedness GI: No abdominal pain or constipation Psych: No depression  Observations/Objective:  Current Outpatient Medications:    aspirin EC 81 MG tablet, Take 81 mg by mouth daily., Disp: , Rfl:    dicyclomine (BENTYL) 10 MG capsule, Take 1  capsule (10 mg total) by mouth 4 (four) times daily -  before meals and at bedtime., Disp: 20 capsule, Rfl: 0   doxycycline (VIBRA-TABS) 100 MG tablet, Take 1 tablet (100 mg total) by mouth 2 (two) times daily., Disp: 20 tablet, Rfl: 0   lisinopril (ZESTRIL) 2.5 MG tablet, TAKE 1 TABLET BY MOUTH ONCE A DAY, Disp: 30 tablet, Rfl: 6   metoprolol tartrate (LOPRESSOR) 25 MG tablet, TAKE 1 TABLET BY MOUTH TWICE A DAY, Disp: 60 tablet, Rfl: 3   Multiple Vitamins-Minerals (MULTIVITAMIN WITH MINERALS) tablet, Take 1 tablet by mouth daily., Disp: , Rfl:    oxaprozin (DAYPRO) 600 MG tablet, TAKE 1 TABLET BY MOUTH ONCE A DAY, Disp: 30 tablet, Rfl: 4   [START ON 07/03/2022] Oxycodone HCl 10 MG TABS, Take 0.5 tablets (5 mg total) by mouth every 4 (four) hours as needed., Disp: 45 tablet, Rfl: 0   [START ON 08/01/2022] Oxycodone HCl 10 MG TABS, Take 0.5 tablets (5 mg total) by mouth 4 (four) times daily as needed., Disp: 45 tablet, Rfl: 0   Past Medical History:  Diagnosis Date   CHF (congestive heart failure) (HCC)    Chronic back pain    Chronic constipation    Chronic fatigue 10/28/2017   Hypertension    Lumbar post-laminectomy syndrome 05/10/2019   Memory difficulty 10/28/2017   Osteoporosis    Scoliosis      Assessment and Plan: 1. Chronic pain syndrome   2. Chronic, continuous use of opioids  3. DDD (degenerative disc disease), lumbar   4. Facet arthropathy, lumbar   5. Lumbar post-laminectomy syndrome   6. Facet arthritis of lumbosacral region   7. Chronic fatigue   Based on our discussion today that is appropriate to continue her current medication therapy.  No other changes will be initiated today and I will keep her at the every 4 hours dosing.  This will be for half a tablet that can be taken as needed.  She generally averages approximately 10 mg a day sometimes 15 mg a day.  She gets good relief with no other side effects noted.  I have reviewed the [facility] practitioner database  information and is appropriate for refill.  This to be dated for 1110 and 12 9 with return to clinic scheduled in 2 months.  She is to continue follow-up with her primary care physicians for baseline medical care.  Follow Up Instructions:    I discussed the assessment and treatment plan with the patient. The patient was provided an opportunity to ask questions and all were answered. The patient agreed with the plan and demonstrated an understanding of the instructions.   The patient was advised to call back or seek an in-person evaluation if the symptoms worsen or if the condition fails to improve as anticipated.  I provided 30 minutes of non-face-to-face time during this encounter.   Molli Barrows, MD

## 2022-07-02 ENCOUNTER — Telehealth: Payer: Self-pay

## 2022-07-02 NOTE — Progress Notes (Signed)
Safety precautions to be maintained throughout the outpatient stay will include: orient to surroundings, keep bed in low position, maintain call bell within reach at all times, provide assistance with transfer out of bed and ambulation.  

## 2022-07-02 NOTE — Progress Notes (Signed)
Discharge instructions reviewed-kh 

## 2022-09-22 ENCOUNTER — Other Ambulatory Visit: Payer: Self-pay | Admitting: Internal Medicine

## 2022-09-29 ENCOUNTER — Ambulatory Visit: Payer: 59 | Attending: Anesthesiology | Admitting: Anesthesiology

## 2022-09-29 ENCOUNTER — Encounter: Payer: Self-pay | Admitting: Anesthesiology

## 2022-09-29 DIAGNOSIS — R5382 Chronic fatigue, unspecified: Secondary | ICD-10-CM | POA: Diagnosis not present

## 2022-09-29 DIAGNOSIS — F1190 Opioid use, unspecified, uncomplicated: Secondary | ICD-10-CM

## 2022-09-29 DIAGNOSIS — M47816 Spondylosis without myelopathy or radiculopathy, lumbar region: Secondary | ICD-10-CM

## 2022-09-29 DIAGNOSIS — M4186 Other forms of scoliosis, lumbar region: Secondary | ICD-10-CM

## 2022-09-29 DIAGNOSIS — G894 Chronic pain syndrome: Secondary | ICD-10-CM

## 2022-09-29 DIAGNOSIS — Z79891 Long term (current) use of opiate analgesic: Secondary | ICD-10-CM | POA: Diagnosis not present

## 2022-09-29 DIAGNOSIS — M961 Postlaminectomy syndrome, not elsewhere classified: Secondary | ICD-10-CM

## 2022-09-29 DIAGNOSIS — M5386 Other specified dorsopathies, lumbar region: Secondary | ICD-10-CM

## 2022-09-29 DIAGNOSIS — M47817 Spondylosis without myelopathy or radiculopathy, lumbosacral region: Secondary | ICD-10-CM | POA: Diagnosis not present

## 2022-09-29 DIAGNOSIS — M5136 Other intervertebral disc degeneration, lumbar region: Secondary | ICD-10-CM

## 2022-09-29 MED ORDER — OXYCODONE HCL 10 MG PO TABS: 5.0000 mg | ORAL_TABLET | Freq: Three times a day (TID) | ORAL | 0 refills | Status: DC

## 2022-09-29 NOTE — Progress Notes (Signed)
Virtual Visit via Telephone Note  I connected with Whitney Rivera on 09/29/22 at  1:00 PM EST by telephone and verified that I am speaking with the correct person using two identifiers.  Location: Patient: Home Provider: Pain control center   I discussed the limitations, risks, security and privacy concerns of performing an evaluation and management service by telephone and the availability of in person appointments. I also discussed with the patient that there may be a patient responsible charge related to this service. The patient expressed understanding and agreed to proceed.   History of Present Illness: I spoke with [name] via telephone as we are unable like for the video portion conference.  She reports that she still having a lot of low back pain hip pain buttock pain and sometimes and posterior leg pain.  She has been able to continue to reduce her hydrocodone use to half tablet per day and occasionally 1/2 tablet twice a day.  She is able to spend these medicines out effectively though there are certain days where the pain is more intense requiring a slightly higher dosage.  She has been compliant with the regimen she has no evidence of any diverting or illicit use and continues to get good pain relief with them.  Unfortunately she does sometimes get a little bit sleepy with the medicines.  The quality characteristic and distribution of the pain otherwise remained stable with no other changes mentioned per discussion today.'s mainly a gnawing aching pain in the low back radiating to the hip buttocks worse with activity and prolonged standing.  Review of systems: General: No fevers or chills Pulmonary: No shortness of breath or dyspnea Cardiac: No angina or palpitations or lightheadedness GI: No abdominal pain or constipation Psych: No depression    Observations/Objective:  Current Outpatient Medications:    aspirin EC 81 MG tablet, Take 81 mg by mouth daily., Disp: , Rfl:     dicyclomine (BENTYL) 10 MG capsule, Take 1 capsule (10 mg total) by mouth 4 (four) times daily -  before meals and at bedtime., Disp: 20 capsule, Rfl: 0   doxycycline (VIBRA-TABS) 100 MG tablet, Take 1 tablet (100 mg total) by mouth 2 (two) times daily., Disp: 20 tablet, Rfl: 0   lisinopril (ZESTRIL) 2.5 MG tablet, TAKE 1 TABLET BY MOUTH ONCE A DAY, Disp: 30 tablet, Rfl: 6   metoprolol tartrate (LOPRESSOR) 25 MG tablet, TAKE 1 TABLET BY MOUTH TWICE A DAY, Disp: 60 tablet, Rfl: 3   Multiple Vitamins-Minerals (MULTIVITAMIN WITH MINERALS) tablet, Take 1 tablet by mouth daily., Disp: , Rfl:    oxaprozin (DAYPRO) 600 MG tablet, TAKE 1 TABLET BY MOUTH ONCE A DAY, Disp: 30 tablet, Rfl: 4   Oxycodone HCl 10 MG TABS, Take 0.5 tablets (5 mg total) by mouth 3 (three) times daily., Disp: 45 tablet, Rfl: 0   [START ON 10/29/2022] Oxycodone HCl 10 MG TABS, Take 0.5 tablets (5 mg total) by mouth 3 (three) times daily after meals., Disp: 45 tablet, Rfl: 0   Past Medical History:  Diagnosis Date   CHF (congestive heart failure) (HCC)    Chronic back pain    Chronic constipation    Chronic fatigue 10/28/2017   Hypertension    Lumbar post-laminectomy syndrome 05/10/2019   Memory difficulty 10/28/2017   Osteoporosis    Scoliosis      Assessment and Plan: 1. Chronic pain syndrome   2. Chronic, continuous use of opioids   3. Facet arthropathy, lumbar   4. Lumbar  post-laminectomy syndrome   5. Facet arthritis of lumbosacral region   6. DDD (degenerative disc disease), lumbar   7. Chronic fatigue   8. Low back derangement syndrome   9. Other forms of scoliosis, lumbar region   Based on our conversation it is appropriate to refill her medicines for the next 2 months.  I have reviewed the [facility] practitioner database information appears appropriate.  She has been compliant with her regimen and continues to get good functional lifestyle improvement with the chronic opioid therapy.  Nothing else has worked  considering more conservative therapy.  Will schedule her for return to clinic in 3 months and continue follow-up with her primary care physician for baseline medical care.  Follow Up Instructions:    I discussed the assessment and treatment plan with the patient. The patient was provided an opportunity to ask questions and all were answered. The patient agreed with the plan and demonstrated an understanding of the instructions.   The patient was advised to call back or seek an in-person evaluation if the symptoms worsen or if the condition fails to improve as anticipated.  I provided 20 minutes of non-face-to-face time during this encounter.   Molli Barrows, MD

## 2022-11-21 ENCOUNTER — Other Ambulatory Visit: Payer: Self-pay | Admitting: Anesthesiology

## 2022-12-14 DIAGNOSIS — J449 Chronic obstructive pulmonary disease, unspecified: Secondary | ICD-10-CM | POA: Diagnosis not present

## 2022-12-14 DIAGNOSIS — M4186 Other forms of scoliosis, lumbar region: Secondary | ICD-10-CM | POA: Diagnosis not present

## 2022-12-14 DIAGNOSIS — R413 Other amnesia: Secondary | ICD-10-CM | POA: Diagnosis not present

## 2022-12-14 DIAGNOSIS — R5382 Chronic fatigue, unspecified: Secondary | ICD-10-CM | POA: Diagnosis not present

## 2022-12-14 DIAGNOSIS — G8929 Other chronic pain: Secondary | ICD-10-CM | POA: Diagnosis not present

## 2023-03-15 ENCOUNTER — Encounter: Payer: Self-pay | Admitting: Anesthesiology

## 2023-03-15 ENCOUNTER — Ambulatory Visit: Payer: 59 | Attending: Anesthesiology | Admitting: Anesthesiology

## 2023-03-15 VITALS — BP 118/74 | HR 98 | Temp 97.3°F | Resp 18 | Ht 64.0 in | Wt 138.0 lb

## 2023-03-15 DIAGNOSIS — F1190 Opioid use, unspecified, uncomplicated: Secondary | ICD-10-CM | POA: Diagnosis present

## 2023-03-15 DIAGNOSIS — M4186 Other forms of scoliosis, lumbar region: Secondary | ICD-10-CM | POA: Insufficient documentation

## 2023-03-15 DIAGNOSIS — M47817 Spondylosis without myelopathy or radiculopathy, lumbosacral region: Secondary | ICD-10-CM | POA: Diagnosis not present

## 2023-03-15 DIAGNOSIS — M5386 Other specified dorsopathies, lumbar region: Secondary | ICD-10-CM | POA: Diagnosis not present

## 2023-03-15 DIAGNOSIS — G894 Chronic pain syndrome: Secondary | ICD-10-CM | POA: Diagnosis not present

## 2023-03-15 DIAGNOSIS — M47816 Spondylosis without myelopathy or radiculopathy, lumbar region: Secondary | ICD-10-CM | POA: Insufficient documentation

## 2023-03-15 DIAGNOSIS — R5382 Chronic fatigue, unspecified: Secondary | ICD-10-CM | POA: Insufficient documentation

## 2023-03-15 DIAGNOSIS — M5136 Other intervertebral disc degeneration, lumbar region: Secondary | ICD-10-CM | POA: Insufficient documentation

## 2023-03-15 DIAGNOSIS — M961 Postlaminectomy syndrome, not elsewhere classified: Secondary | ICD-10-CM | POA: Diagnosis not present

## 2023-03-15 MED ORDER — OXYCODONE HCL 10 MG PO TABS: 5.0000 mg | ORAL_TABLET | Freq: Four times a day (QID) | ORAL | 0 refills | Status: DC

## 2023-03-15 MED ORDER — CYCLOBENZAPRINE HCL 5 MG PO TABS: 5.0000 mg | ORAL_TABLET | Freq: Two times a day (BID) | ORAL | 5 refills | Status: AC

## 2023-03-15 NOTE — Progress Notes (Unsigned)
Nursing Pain Medication Assessment:  Safety precautions to be maintained throughout the outpatient stay will include: orient to surroundings, keep bed in low position, maintain call bell within reach at all times, provide assistance with transfer out of bed and ambulation.  Medication Inspection Compliance: Pill count conducted under aseptic conditions, in front of the patient. Neither the pills nor the bottle was removed from the patient's sight at any time. Once count was completed pills were immediately returned to the patient in their original bottle.  Medication: Oxycodone IR Pill/Patch Count:  23 of 45 pills remain Pill/Patch Appearance: Markings consistent with prescribed medication Bottle Appearance: Standard pharmacy container. Clearly labeled. Filled Date: did not bring most recent bottle. Last Medication intake:  TodaySafety precautions to be maintained throughout the outpatient stay will include: orient to surroundings, keep bed in low position, maintain call bell within reach at all times, provide assistance with transfer out of bed and ambulation.

## 2023-03-16 DIAGNOSIS — G894 Chronic pain syndrome: Secondary | ICD-10-CM | POA: Diagnosis not present

## 2023-03-16 NOTE — Progress Notes (Signed)
Subjective:  Patient ID: Whitney Rivera, female    DOB: 03-Dec-1960  Age: 62 y.o. MRN: [id number]  CC: Back Pain (lower)   Procedure: None  HPI Aniya Lieurance presents for reevaluation.  Send he continues to have issues with low back pain but this is generally stable in nature.  She takes her medications for this and these continue to work well for her.  No side effects reported.  She reports using a half of a 10 mg tablet approximately 2-4 times a day depending on how severe her pain is.  This works well giving her about 70 to 80% relief.  She has failed more conservative therapy with more conservative medication management.  She continues to get good functional improvement with the medicines generally enabling her to do her activities and exercises and sleep better.  She reports no side effects or problems with the medication.  No change in upper or lower extremity strength function bowel or bladder function is noted.    Outpatient Medications Prior to Visit  Medication Sig Dispense Refill   metoprolol tartrate (LOPRESSOR) 25 MG tablet TAKE 1 TABLET BY MOUTH TWICE A DAY 60 tablet 3   oxaprozin (DAYPRO) 600 MG tablet TAKE 1 TABLET BY MOUTH ONCE A DAY 30 tablet 4   cyclobenzaprine (FLEXERIL) 5 MG tablet Take by mouth.     Oxycodone HCl 10 MG TABS TAKE ONE HALF TABLET (5MG ) BY MOUTH FOUR TIMES DAILY 60 tablet 0   aspirin EC 81 MG tablet Take 81 mg by mouth daily. (Patient not taking: Reported on 03/15/2023)     dicyclomine (BENTYL) 10 MG capsule Take 1 capsule (10 mg total) by mouth 4 (four) times daily -  before meals and at bedtime. 20 capsule 0   doxycycline (VIBRA-TABS) 100 MG tablet Take 1 tablet (100 mg total) by mouth 2 (two) times daily. (Patient not taking: Reported on 03/15/2023) 20 tablet 0   lisinopril (ZESTRIL) 2.5 MG tablet TAKE 1 TABLET BY MOUTH ONCE A DAY (Patient not taking: Reported on 03/15/2023) 30 tablet 6   Multiple Vitamins-Minerals (MULTIVITAMIN WITH MINERALS) tablet Take 1  tablet by mouth daily. (Patient not taking: Reported on 03/15/2023)     Oxycodone HCl 10 MG TABS Take 0.5 tablets (5 mg total) by mouth 3 (three) times daily after meals. 45 tablet 0   Oxycodone HCl 10 MG TABS Take 0.5 tablets (5 mg total) by mouth 3 (three) times daily. 45 tablet 0   No facility-administered medications prior to visit.    Review of Systems CNS: No confusion or sedation Cardiac: No angina or palpitations GI: No abdominal pain or constipation Constitutional: No nausea vomiting fevers or chills  Objective:  BP 118/74   Pulse 98   Temp (!) 97.3 F (36.3 C) (Temporal)   Resp 18   Ht 5\' 4"  (1.626 m)   Wt 138 lb (62.6 kg)   SpO2 93%   BMI 23.69 kg/m    BP Readings from Last 3 Encounters:  03/15/23 118/74  11/24/21 134/84  09/15/21 136/74     Wt Readings from Last 3 Encounters:  03/15/23 138 lb (62.6 kg)  11/24/21 134 lb (60.8 kg)  09/15/21 135 lb 11.2 oz (61.6 kg)     Physical Exam Pt is alert and oriented PERRL EOMI HEART IS RRR no murmur or rub LCTA no wheezing or rales MUSCULOSKELETAL reveals some paraspinous muscle tenderness in the lumbar region.  Lower extremity strength appears to be at baseline.  Muscle tone and  bulk at baseline.  She walks with an antalgic gait.  Labs  No results found for: "HGBA1C" Lab Results  Component Value Date   LDLCALC 149 (H) 03/04/2021   CREATININE 0.98 03/04/2021    -------------------------------------------------------------------------------------------------------------------- Lab Results  Component Value Date   WBC 6.3 03/04/2021   HGB 14.5 03/04/2021   HCT 45.2 (H) 03/04/2021   PLT 227 03/04/2021   GLUCOSE 92 03/04/2021   CHOL 230 (H) 03/04/2021   TRIG 185 (H) 03/04/2021   HDL 49 (L) 03/04/2021   LDLCALC 149 (H) 03/04/2021   ALT 11 03/04/2021   AST 20 03/04/2021   NA 140 03/04/2021   K 4.5 03/04/2021   CL 106 03/04/2021   CREATININE 0.98 03/04/2021   BUN 14 03/04/2021   CO2 21 03/04/2021    TSH 3.26 03/04/2021    --------------------------------------------------------------------------------------------------------------------- No results found.   Assessment & Plan:   Oline was seen today for back pain.  Diagnoses and all orders for this visit:  Chronic pain syndrome -     ToxASSURE Select 13 (MW), Urine  Chronic, continuous use of opioids -     ToxASSURE Select 13 (MW), Urine  Lumbar post-laminectomy syndrome  Facet arthritis of lumbosacral region  DDD (degenerative disc disease), lumbar  Facet arthropathy, lumbar  Low back derangement syndrome  Chronic fatigue  Other forms of scoliosis, lumbar region  Other orders -     Oxycodone HCl 10 MG TABS; Take 0.5 tablets (5 mg total) by mouth every 6 (six) hours. -     Oxycodone HCl 10 MG TABS; Take 0.5 tablets (5 mg total) by mouth every 6 (six) hours. -     cyclobenzaprine (FLEXERIL) 5 MG tablet; Take 1 tablet (5 mg total) by mouth 2 (two) times daily.        ----------------------------------------------------------------------------------------------------------------------  Problem List Items Addressed This Visit       Unprioritized   Chronic fatigue   Chronic pain syndrome - Primary   Relevant Medications   Oxycodone HCl 10 MG TABS   Oxycodone HCl 10 MG TABS (Start on 04/14/2023)   cyclobenzaprine (FLEXERIL) 5 MG tablet   Other Relevant Orders   ToxASSURE Select 13 (MW), Urine   Chronic, continuous use of opioids   Relevant Orders   ToxASSURE Select 13 (MW), Urine   DDD (degenerative disc disease), lumbar   Relevant Medications   Oxycodone HCl 10 MG TABS   Oxycodone HCl 10 MG TABS (Start on 04/14/2023)   cyclobenzaprine (FLEXERIL) 5 MG tablet   Facet arthropathy, lumbar   Low back derangement syndrome   Relevant Medications   Oxycodone HCl 10 MG TABS   Oxycodone HCl 10 MG TABS (Start on 04/14/2023)   cyclobenzaprine (FLEXERIL) 5 MG tablet   Lumbar post-laminectomy syndrome   Other  forms of scoliosis, lumbar region   Other Visit Diagnoses     Facet arthritis of lumbosacral region       Relevant Medications   Oxycodone HCl 10 MG TABS   Oxycodone HCl 10 MG TABS (Start on 04/14/2023)   cyclobenzaprine (FLEXERIL) 5 MG tablet         ----------------------------------------------------------------------------------------------------------------------  1. Chronic pain syndrome I have reviewed the [facility] practitioner database information is appropriate for refill.  Refills be generated for the next 2 months. - ToxASSURE Select 13 (MW), Urine  2. Chronic, continuous use of opioids As above - ToxASSURE Select 13 (MW), Urine  3. Lumbar post-laminectomy syndrome Continue core stretching strengthening exercises as reviewed today.  He is to continue follow-up with her primary care physicians for baseline medical care.  4. Facet arthritis of lumbosacral region   5. DDD (degenerative disc disease), lumbar   6. Facet arthropathy, lumbar   7. Low back derangement syndrome   8. Chronic fatigue   9. Other forms of scoliosis, lumbar region     ----------------------------------------------------------------------------------------------------------------------  I have changed Shavon Mateo's Oxycodone HCl, Oxycodone HCl, and cyclobenzaprine. I am also having her maintain her aspirin EC, multivitamin with minerals, dicyclomine, doxycycline, lisinopril, metoprolol tartrate, oxaprozin, and Oxycodone HCl.   Meds ordered this encounter  Medications   Oxycodone HCl 10 MG TABS    Sig: Take 0.5 tablets (5 mg total) by mouth every 6 (six) hours.    Dispense:  60 tablet    Refill:  0   Oxycodone HCl 10 MG TABS    Sig: Take 0.5 tablets (5 mg total) by mouth every 6 (six) hours.    Dispense:  60 tablet    Refill:  0   cyclobenzaprine (FLEXERIL) 5 MG tablet    Sig: Take 1 tablet (5 mg total) by mouth 2 (two) times daily.    Dispense:  60 tablet    Refill:   5   Patient's Medications  New Prescriptions   No medications on file  Previous Medications   ASPIRIN EC 81 MG TABLET    Take 81 mg by mouth daily.   DICYCLOMINE (BENTYL) 10 MG CAPSULE    Take 1 capsule (10 mg total) by mouth 4 (four) times daily -  before meals and at bedtime.   DOXYCYCLINE (VIBRA-TABS) 100 MG TABLET    Take 1 tablet (100 mg total) by mouth 2 (two) times daily.   LISINOPRIL (ZESTRIL) 2.5 MG TABLET    TAKE 1 TABLET BY MOUTH ONCE A DAY   METOPROLOL TARTRATE (LOPRESSOR) 25 MG TABLET    TAKE 1 TABLET BY MOUTH TWICE A DAY   MULTIPLE VITAMINS-MINERALS (MULTIVITAMIN WITH MINERALS) TABLET    Take 1 tablet by mouth daily.   OXAPROZIN (DAYPRO) 600 MG TABLET    TAKE 1 TABLET BY MOUTH ONCE A DAY   OXYCODONE HCL 10 MG TABS    Take 0.5 tablets (5 mg total) by mouth 3 (three) times daily after meals.  Modified Medications   Modified Medication Previous Medication   CYCLOBENZAPRINE (FLEXERIL) 5 MG TABLET cyclobenzaprine (FLEXERIL) 5 MG tablet      Take 1 tablet (5 mg total) by mouth 2 (two) times daily.    Take by mouth.   OXYCODONE HCL 10 MG TABS Oxycodone HCl 10 MG TABS      Take 0.5 tablets (5 mg total) by mouth every 6 (six) hours.    Take 0.5 tablets (5 mg total) by mouth 3 (three) times daily.   OXYCODONE HCL 10 MG TABS Oxycodone HCl 10 MG TABS      Take 0.5 tablets (5 mg total) by mouth every 6 (six) hours.    TAKE ONE HALF TABLET (5MG ) BY MOUTH FOUR TIMES DAILY  Discontinued Medications   No medications on file   ----------------------------------------------------------------------------------------------------------------------  Follow-up: Return in about 2 months (around 05/16/2023) for evaluation, med refill.    [name], MD

## 2023-05-12 ENCOUNTER — Other Ambulatory Visit: Payer: Self-pay | Admitting: Anesthesiology

## 2023-06-02 DIAGNOSIS — Z0000 Encounter for general adult medical examination without abnormal findings: Secondary | ICD-10-CM | POA: Diagnosis not present

## 2023-06-04 DIAGNOSIS — R5382 Chronic fatigue, unspecified: Secondary | ICD-10-CM | POA: Diagnosis not present

## 2023-06-04 DIAGNOSIS — Z720 Tobacco use: Secondary | ICD-10-CM | POA: Diagnosis not present

## 2023-06-04 DIAGNOSIS — J449 Chronic obstructive pulmonary disease, unspecified: Secondary | ICD-10-CM | POA: Diagnosis not present

## 2023-06-04 DIAGNOSIS — E7800 Pure hypercholesterolemia, unspecified: Secondary | ICD-10-CM | POA: Diagnosis not present

## 2023-06-04 DIAGNOSIS — R413 Other amnesia: Secondary | ICD-10-CM | POA: Diagnosis not present

## 2023-06-04 DIAGNOSIS — I10 Essential (primary) hypertension: Secondary | ICD-10-CM | POA: Diagnosis not present

## 2023-06-07 DIAGNOSIS — R413 Other amnesia: Secondary | ICD-10-CM | POA: Diagnosis not present

## 2023-06-07 DIAGNOSIS — J449 Chronic obstructive pulmonary disease, unspecified: Secondary | ICD-10-CM | POA: Diagnosis not present

## 2023-06-07 DIAGNOSIS — R5382 Chronic fatigue, unspecified: Secondary | ICD-10-CM | POA: Diagnosis not present

## 2023-06-08 ENCOUNTER — Other Ambulatory Visit: Payer: Self-pay | Admitting: Anesthesiology

## 2023-06-14 DIAGNOSIS — E7800 Pure hypercholesterolemia, unspecified: Secondary | ICD-10-CM | POA: Diagnosis not present

## 2023-06-14 DIAGNOSIS — J449 Chronic obstructive pulmonary disease, unspecified: Secondary | ICD-10-CM | POA: Diagnosis not present

## 2023-06-14 DIAGNOSIS — R413 Other amnesia: Secondary | ICD-10-CM | POA: Diagnosis not present

## 2023-07-09 ENCOUNTER — Other Ambulatory Visit: Payer: Self-pay | Admitting: Anesthesiology

## 2023-08-30 DIAGNOSIS — K625 Hemorrhage of anus and rectum: Secondary | ICD-10-CM | POA: Diagnosis not present

## 2023-08-30 DIAGNOSIS — R197 Diarrhea, unspecified: Secondary | ICD-10-CM | POA: Diagnosis not present

## 2023-08-30 DIAGNOSIS — A09 Infectious gastroenteritis and colitis, unspecified: Secondary | ICD-10-CM | POA: Diagnosis not present

## 2023-09-02 DIAGNOSIS — R197 Diarrhea, unspecified: Secondary | ICD-10-CM | POA: Diagnosis not present

## 2024-03-27 ENCOUNTER — Ambulatory Visit: Attending: Anesthesiology | Admitting: Anesthesiology

## 2024-03-27 ENCOUNTER — Encounter: Payer: Self-pay | Admitting: Anesthesiology

## 2024-03-27 VITALS — BP 147/66 | HR 79 | Temp 98.0°F | Resp 16 | Ht 64.0 in | Wt 107.0 lb

## 2024-03-27 DIAGNOSIS — M47817 Spondylosis without myelopathy or radiculopathy, lumbosacral region: Secondary | ICD-10-CM | POA: Diagnosis not present

## 2024-03-27 DIAGNOSIS — F1190 Opioid use, unspecified, uncomplicated: Secondary | ICD-10-CM | POA: Insufficient documentation

## 2024-03-27 DIAGNOSIS — M5136 Other intervertebral disc degeneration, lumbar region with discogenic back pain only: Secondary | ICD-10-CM | POA: Diagnosis not present

## 2024-03-27 DIAGNOSIS — M47816 Spondylosis without myelopathy or radiculopathy, lumbar region: Secondary | ICD-10-CM | POA: Insufficient documentation

## 2024-03-27 DIAGNOSIS — M961 Postlaminectomy syndrome, not elsewhere classified: Secondary | ICD-10-CM | POA: Diagnosis not present

## 2024-03-27 DIAGNOSIS — G894 Chronic pain syndrome: Secondary | ICD-10-CM | POA: Insufficient documentation

## 2024-03-27 DIAGNOSIS — M5386 Other specified dorsopathies, lumbar region: Secondary | ICD-10-CM | POA: Insufficient documentation

## 2024-03-27 MED ORDER — CYCLOBENZAPRINE HCL 5 MG PO TABS: 5.0000 mg | ORAL_TABLET | Freq: Two times a day (BID) | ORAL | 3 refills | Status: AC

## 2024-03-27 MED ORDER — OXYCODONE HCL 10 MG PO TABS: 5.0000 mg | ORAL_TABLET | Freq: Three times a day (TID) | ORAL | 0 refills | Status: DC

## 2024-03-27 MED ORDER — OXYCODONE HCL 10 MG PO TABS: 5.0000 mg | ORAL_TABLET | Freq: Four times a day (QID) | ORAL | 0 refills | Status: AC

## 2024-03-27 NOTE — Progress Notes (Unsigned)
 Nursing Pain Medication Assessment:  Safety precautions to be maintained throughout the outpatient stay will include: orient to surroundings, keep bed in low position, maintain call bell within reach at all times, provide assistance with transfer out of bed and ambulation.  Medication Inspection Compliance: Whitney Rivera did not comply with our request to bring her pills to be counted. She was reminded that bringing the medication bottles, even when empty, is a requirement.  Medication: Oxycodone  IR Pill/Patch Count: 13 of 60 pills/patches remain Pill/Patch Appearance: Markings consistent with prescribed medication Bottle Appearance: Standard pharmacy container. Clearly labeled. Filled Date: 5 / 18 / 2024 Last Medication intake:  Today

## 2024-03-27 NOTE — Patient Instructions (Signed)

## 2024-03-28 NOTE — Progress Notes (Signed)
 Subjective:  Patient ID: Whitney Rivera, female    DOB: 01-Nov-1960  Age: 63 y.o. MRN: [id number]  CC: Back Pain (Upper, mid and lower )   Procedure: None  HPI Whitney Rivera presents for reevaluation.  She was last seen several months ago and uses chronic opioid therapy secondary to diffuse neck and low back pain.  In the past she has been taking 1/2 tablet 2-4 times a day but has not been able to wean this back and is now using medication sparingly.  The quality characteristic and distribution of the pain that she has had have been stable in nature with no recent changes noted.  In fact, for the most part her back pain and neck pain have been stable enough to the point where she is using limited amount of medication but she has had a recent mild exacerbation.  Her last fill on opioids was back in November of last year.  She uses these very sparingly and they generally work well for her when she takes them without side effects.  She is try to do stretching strengthening exercises and stay active with the warmer weather.  Outpatient Medications Prior to Visit  Medication Sig Dispense Refill   metoprolol  tartrate (LOPRESSOR ) 25 MG tablet TAKE 1 TABLET BY MOUTH TWICE A DAY 60 tablet 3   oxaprozin (DAYPRO) 600 MG tablet TAKE 1 TABLET BY MOUTH ONCE A DAY 30 tablet 4   Oxycodone  HCl 10 MG TABS TAKE 0.5 TABLETS (5 MG TOTAL) BY MOUTH IN THE MORNING, AT NOON, IN THE EVENING, AND AT BEDTIME. 60 tablet 0   Oxycodone  HCl 10 MG TABS Take 0.5 tablets (5 mg total) by mouth 3 (three) times daily after meals. 45 tablet 0   Oxycodone  HCl 10 MG TABS Take 0.5 tablets (5 mg total) by mouth every 6 (six) hours. 60 tablet 0   aspirin EC 81 MG tablet Take 81 mg by mouth daily. (Patient not taking: Reported on 03/27/2024)     dicyclomine  (BENTYL ) 10 MG capsule Take 1 capsule (10 mg total) by mouth 4 (four) times daily -  before meals and at bedtime. (Patient not taking: Reported on 03/27/2024) 20 capsule 0   doxycycline   (VIBRA -TABS) 100 MG tablet Take 1 tablet (100 mg total) by mouth 2 (two) times daily. (Patient not taking: Reported on 03/15/2023) 20 tablet 0   lisinopril  (ZESTRIL ) 2.5 MG tablet TAKE 1 TABLET BY MOUTH ONCE A DAY (Patient not taking: Reported on 03/15/2023) 30 tablet 6   Multiple Vitamins-Minerals (MULTIVITAMIN WITH MINERALS) tablet Take 1 tablet by mouth daily. (Patient not taking: Reported on 03/15/2023)     No facility-administered medications prior to visit.    Review of Systems CNS: No confusion or sedation Cardiac: No angina or palpitations GI: No abdominal pain or constipation Constitutional: No nausea vomiting fevers or chills  Objective:  BP (!) 147/66 (BP Location: Right Arm, Patient Position: Sitting, Cuff Size: Normal)   Pulse 79   Temp 98 F (36.7 C) (Temporal)   Resp 16   Ht 5' 4 (1.626 m)   Wt 107 lb (48.5 kg)   SpO2 98%   BMI 18.37 kg/m    BP Readings from Last 3 Encounters:  03/27/24 (!) 147/66  03/15/23 118/74  11/24/21 134/84     Wt Readings from Last 3 Encounters:  03/27/24 107 lb (48.5 kg)  03/15/23 138 lb (62.6 kg)  11/24/21 134 lb (60.8 kg)     Physical Exam Pt is alert and  oriented PERRL EOMI HEART IS RRR no murmur or rub LCTA no wheezing or rales MUSCULOSKELETAL reveals some paraspinous muscle tenderness but no overt trigger points.  She is walking with a mildly antalgic gait but seems to have good preservation of lower extremity strength and function.  Labs  No results found for: HGBA1C Lab Results  Component Value Date   LDLCALC 149 (H) 03/04/2021   CREATININE 0.98 03/04/2021    -------------------------------------------------------------------------------------------------------------------- Lab Results  Component Value Date   WBC 6.3 03/04/2021   HGB 14.5 03/04/2021   HCT 45.2 (H) 03/04/2021   PLT 227 03/04/2021   GLUCOSE 92 03/04/2021   CHOL 230 (H) 03/04/2021   TRIG 185 (H) 03/04/2021   HDL 49 (L) 03/04/2021   LDLCALC  149 (H) 03/04/2021   ALT 11 03/04/2021   AST 20 03/04/2021   NA 140 03/04/2021   K 4.5 03/04/2021   CL 106 03/04/2021   CREATININE 0.98 03/04/2021   BUN 14 03/04/2021   CO2 21 03/04/2021   TSH 3.26 03/04/2021    --------------------------------------------------------------------------------------------------------------------- No results found.   Assessment & Plan:   Whitney Rivera was seen today for back pain.  Diagnoses and all orders for this visit:  Chronic pain syndrome -     ToxASSURE Select 13 (MW), Urine  Chronic, continuous use of opioids -     ToxASSURE Select 13 (MW), Urine  Lumbar post-laminectomy syndrome  Facet arthritis of lumbosacral region  Degeneration of intervertebral disc of lumbar region with discogenic back pain  Facet arthropathy, lumbar  Low back derangement syndrome  Other orders -     Oxycodone  HCl 10 MG TABS; Take 0.5 tablets (5 mg total) by mouth 4 (four) times daily - after meals and at bedtime. -     Oxycodone  HCl 10 MG TABS; Take 0.5 tablets (5 mg total) by mouth every 6 (six) hours. -     cyclobenzaprine  (FLEXERIL ) 5 MG tablet; Take 1 tablet (5 mg total) by mouth 2 (two) times daily.        ----------------------------------------------------------------------------------------------------------------------  Problem List Items Addressed This Visit       Unprioritized   Chronic pain syndrome - Primary   Relevant Medications   Oxycodone  HCl 10 MG TABS   Oxycodone  HCl 10 MG TABS (Start on 04/26/2024)   cyclobenzaprine  (FLEXERIL ) 5 MG tablet   Other Relevant Orders   ToxASSURE Select 13 (MW), Urine   Chronic, continuous use of opioids   Relevant Orders   ToxASSURE Select 13 (MW), Urine   DDD (degenerative disc disease), lumbar   Facet arthropathy, lumbar   Low back derangement syndrome   Relevant Medications   Oxycodone  HCl 10 MG TABS   Oxycodone  HCl 10 MG TABS (Start on 04/26/2024)   cyclobenzaprine  (FLEXERIL ) 5 MG tablet    Lumbar post-laminectomy syndrome   Other Visit Diagnoses       Facet arthritis of lumbosacral region       Relevant Medications   Oxycodone  HCl 10 MG TABS   Oxycodone  HCl 10 MG TABS (Start on 04/26/2024)   cyclobenzaprine  (FLEXERIL ) 5 MG tablet         ----------------------------------------------------------------------------------------------------------------------  1. Chronic pain syndrome (Primary) Will proceed with repeat refill today.  This to be dated for August 5 and September 4.  No other changes in her regimen are initiated.  I want her to continue with core stretching strengthening exercises as she is doing. - ToxASSURE Select 13 (MW), Urine  2. Chronic, continuous use of opioids As  above - ToxASSURE Select 13 (MW), Urine  3. Lumbar post-laminectomy syndrome As above  4. Facet arthritis of lumbosacral region   5. Degeneration of intervertebral disc of lumbar region with discogenic back pain   6. Facet arthropathy, lumbar   7. Low back derangement syndrome And continue follow-up with her primary care physician for baseline medical care.  She is scheduled for 19-month return    ----------------------------------------------------------------------------------------------------------------------  I have changed Whitney Rivera's Oxycodone  HCl. I am also having her start on cyclobenzaprine . Additionally, I am having her maintain her aspirin EC, multivitamin with minerals, dicyclomine , doxycycline , lisinopril , metoprolol  tartrate, oxaprozin, Oxycodone  HCl, and Oxycodone  HCl.   Meds ordered this encounter  Medications   Oxycodone  HCl 10 MG TABS    Sig: Take 0.5 tablets (5 mg total) by mouth 4 (four) times daily - after meals and at bedtime.    Dispense:  60 tablet    Refill:  0   Oxycodone  HCl 10 MG TABS    Sig: Take 0.5 tablets (5 mg total) by mouth every 6 (six) hours.    Dispense:  60 tablet    Refill:  0   cyclobenzaprine  (FLEXERIL ) 5 MG tablet    Sig:  Take 1 tablet (5 mg total) by mouth 2 (two) times daily.    Dispense:  60 tablet    Refill:  3   Patient's Medications  New Prescriptions   CYCLOBENZAPRINE  (FLEXERIL ) 5 MG TABLET    Take 1 tablet (5 mg total) by mouth 2 (two) times daily.  Previous Medications   ASPIRIN EC 81 MG TABLET    Take 81 mg by mouth daily.   DICYCLOMINE  (BENTYL ) 10 MG CAPSULE    Take 1 capsule (10 mg total) by mouth 4 (four) times daily -  before meals and at bedtime.   DOXYCYCLINE  (VIBRA -TABS) 100 MG TABLET    Take 1 tablet (100 mg total) by mouth 2 (two) times daily.   LISINOPRIL  (ZESTRIL ) 2.5 MG TABLET    TAKE 1 TABLET BY MOUTH ONCE A DAY   METOPROLOL  TARTRATE (LOPRESSOR ) 25 MG TABLET    TAKE 1 TABLET BY MOUTH TWICE A DAY   MULTIPLE VITAMINS-MINERALS (MULTIVITAMIN WITH MINERALS) TABLET    Take 1 tablet by mouth daily.   OXAPROZIN (DAYPRO) 600 MG TABLET    TAKE 1 TABLET BY MOUTH ONCE A DAY   OXYCODONE  HCL 10 MG TABS    TAKE 0.5 TABLETS (5 MG TOTAL) BY MOUTH IN THE MORNING, AT NOON, IN THE EVENING, AND AT BEDTIME.  Modified Medications   Modified Medication Previous Medication   OXYCODONE  HCL 10 MG TABS Oxycodone  HCl 10 MG TABS      Take 0.5 tablets (5 mg total) by mouth 4 (four) times daily - after meals and at bedtime.    Take 0.5 tablets (5 mg total) by mouth 3 (three) times daily after meals.   OXYCODONE  HCL 10 MG TABS Oxycodone  HCl 10 MG TABS      Take 0.5 tablets (5 mg total) by mouth every 6 (six) hours.    Take 0.5 tablets (5 mg total) by mouth every 6 (six) hours.  Discontinued Medications   No medications on file   ----------------------------------------------------------------------------------------------------------------------  Follow-up: Return in about 2 months (around 05/27/2024) for evaluation, med refill.    Lynwood KANDICE Clause, MD

## 2024-03-29 LAB — TOXASSURE SELECT 13 (MW), URINE

## 2024-04-06 ENCOUNTER — Telehealth: Payer: Self-pay | Admitting: Anesthesiology

## 2024-04-06 NOTE — Telephone Encounter (Signed)
 PT called stated that pharmacy needs a PA for Oxycodone . Please give patient a call. TY

## 2024-04-06 NOTE — Telephone Encounter (Signed)
 PA approved and patient is aware

## 2024-05-11 ENCOUNTER — Ambulatory Visit: Admitting: Family Medicine

## 2024-05-19 ENCOUNTER — Encounter: Payer: Self-pay | Admitting: Anesthesiology

## 2024-05-22 ENCOUNTER — Ambulatory Visit: Attending: Anesthesiology | Admitting: Anesthesiology

## 2024-05-22 ENCOUNTER — Encounter: Payer: Self-pay | Admitting: Anesthesiology

## 2024-05-22 DIAGNOSIS — M961 Postlaminectomy syndrome, not elsewhere classified: Secondary | ICD-10-CM

## 2024-05-22 DIAGNOSIS — M4186 Other forms of scoliosis, lumbar region: Secondary | ICD-10-CM

## 2024-05-22 DIAGNOSIS — M5136 Other intervertebral disc degeneration, lumbar region with discogenic back pain only: Secondary | ICD-10-CM | POA: Diagnosis not present

## 2024-05-22 DIAGNOSIS — F1190 Opioid use, unspecified, uncomplicated: Secondary | ICD-10-CM

## 2024-05-22 DIAGNOSIS — M5386 Other specified dorsopathies, lumbar region: Secondary | ICD-10-CM | POA: Diagnosis not present

## 2024-05-22 DIAGNOSIS — M47817 Spondylosis without myelopathy or radiculopathy, lumbosacral region: Secondary | ICD-10-CM

## 2024-05-22 DIAGNOSIS — M47816 Spondylosis without myelopathy or radiculopathy, lumbar region: Secondary | ICD-10-CM

## 2024-05-22 DIAGNOSIS — G894 Chronic pain syndrome: Secondary | ICD-10-CM

## 2024-05-22 MED ORDER — OXYCODONE HCL 10 MG PO TABS: 5.0000 mg | ORAL_TABLET | Freq: Four times a day (QID) | ORAL | 0 refills | Status: AC

## 2024-05-22 NOTE — Progress Notes (Unsigned)
 Virtual Visit via Telephone Note  I connected with Whitney Rivera on 05/22/24 at  4:20 PM EDT by telephone and verified that I am speaking with the correct person using two identifiers.  Location: Patient: Home Provider: [facility]   I discussed the limitations, risks, security and privacy concerns of performing an evaluation and management service by telephone and the availability of in person appointments. I also discussed with the patient that there may be a patient responsible charge related to this service. The patient expressed understanding and agreed to proceed.   History of Present Illness: I spoke with Whitney Rivera via telephone as we were unable like for the video portion of this conference.  She reports that her low back pain has been stable and she has been staying active.  She is taking her medications at 5 mg of oxycodone  4 times a day.  She generally getting about 80% relief lasting about 4 to 6 hours before she has recurrence of her same baseline pain.  Otherwise no change in the quality characteristic or distribution of her low back pain or leg pain is noted.  He is trying to stay active doing physical therapy exercises and enjoying the cooler weather and walking more.  Without the medication she reports that she is incapacitated and is very limited on her activity level.  No side effects of the medicine are reported.  Review of systems: General: No fevers or chills Pulmonary: No shortness of breath or dyspnea Cardiac: No angina or palpitations or lightheadedness GI: No abdominal pain or constipation Psych: No depression    History of Present Illness:   Deferred  Observations/Objective: Current Outpatient Medications on File Prior to Visit  Medication Sig Dispense Refill   aspirin EC 81 MG tablet Take 81 mg by mouth daily.     metoprolol  tartrate (LOPRESSOR ) 25 MG tablet Take 1 tablet by mouth 2 (two) times daily.     oxaprozin (DAYPRO) 600 MG tablet TAKE 1  TABLET BY MOUTH ONCE A DAY 30 tablet 4   Oxycodone  HCl 10 MG TABS Take 0.5 tablets (5 mg total) by mouth every 6 (six) hours. 60 tablet 0   dicyclomine  (BENTYL ) 10 MG capsule Take 1 capsule (10 mg total) by mouth 4 (four) times daily -  before meals and at bedtime. (Patient not taking: Reported on 05/19/2024) 20 capsule 0   doxycycline  (VIBRA -TABS) 100 MG tablet Take 1 tablet (100 mg total) by mouth 2 (two) times daily. (Patient not taking: Reported on 05/19/2024) 20 tablet 0   lisinopril  (ZESTRIL ) 2.5 MG tablet TAKE 1 TABLET BY MOUTH ONCE A DAY (Patient not taking: Reported on 05/19/2024) 30 tablet 6   metoprolol  tartrate (LOPRESSOR ) 25 MG tablet TAKE 1 TABLET BY MOUTH TWICE A DAY 60 tablet 3   Multiple Vitamins-Minerals (MULTIVITAMIN WITH MINERALS) tablet Take 1 tablet by mouth daily. (Patient not taking: Reported on 05/19/2024)     No current facility-administered medications on file prior to visit.   Past Medical History:  Diagnosis Date   CHF (congestive heart failure) (HCC)    Chronic back pain    Chronic constipation    Chronic fatigue 10/28/2017   Hypertension    Lumbar post-laminectomy syndrome 05/10/2019   Memory difficulty 10/28/2017   Osteoporosis    Scoliosis     Assessment and Plan: Chronic pain syndrome - Plan: ToxASSURE Select 13 (MW), Urine  Chronic, continuous use of opioids - Plan: ToxASSURE Select 13 (MW), Urine  Lumbar post-laminectomy syndrome  Facet  arthritis of lumbosacral region  Degeneration of intervertebral disc of lumbar region with discogenic back pain  Facet arthropathy, lumbar  Low back derangement syndrome  Other forms of scoliosis, lumbar region Based on our conversation today I think is appropriate to refill her medicines.  She does mention that she was around someone smoking marijuana recently and did show positive on her last urine screen.  We are requesting a repeat urine tox screen as she denies utilizing marijuana but [place] and  around several friends who are smoking it.  She denies current use.  She is taking her medications as prescribed and has been compliant with chronic opioid therapy.  We did talk about risk benefits of chronic opioid therapy especially with coadministration or close contact with marijuana.  She is aware that we need to repeat urine test.  She is aware of clinic policy regarding opioid protocol.  I want her to continue stretching strengthening exercises with scheduled return to clinic in 2 months.  Continue follow-up with her primary care physicians for baseline medical care.  Follow Up Instructions:    I discussed the assessment and treatment plan with the patient. The patient was provided an opportunity to ask questions and all were answered. The patient agreed with the plan and demonstrated an understanding of the instructions.   The patient was advised to call back or seek an in-person evaluation if the symptoms worsen or if the condition fails to improve as anticipated.  I provided 30 minutes of non-face-to-face time during this encounter.    Lynwood KANDICE Clause, MD

## 2024-05-23 ENCOUNTER — Ambulatory Visit: Admitting: Physician Assistant

## 2024-05-23 ENCOUNTER — Encounter: Payer: Self-pay | Admitting: Physician Assistant

## 2024-05-23 VITALS — BP 99/88 | HR 74 | Resp 14 | Ht 64.0 in | Wt 108.2 lb

## 2024-05-23 DIAGNOSIS — R5383 Other fatigue: Secondary | ICD-10-CM

## 2024-05-23 DIAGNOSIS — I10 Essential (primary) hypertension: Secondary | ICD-10-CM | POA: Diagnosis not present

## 2024-05-23 DIAGNOSIS — Z87891 Personal history of nicotine dependence: Secondary | ICD-10-CM | POA: Diagnosis not present

## 2024-05-23 DIAGNOSIS — Z1159 Encounter for screening for other viral diseases: Secondary | ICD-10-CM

## 2024-05-23 DIAGNOSIS — G894 Chronic pain syndrome: Secondary | ICD-10-CM

## 2024-05-23 DIAGNOSIS — J3089 Other allergic rhinitis: Secondary | ICD-10-CM

## 2024-05-23 DIAGNOSIS — E7800 Pure hypercholesterolemia, unspecified: Secondary | ICD-10-CM

## 2024-05-23 DIAGNOSIS — F1190 Opioid use, unspecified, uncomplicated: Secondary | ICD-10-CM

## 2024-05-23 DIAGNOSIS — M62838 Other muscle spasm: Secondary | ICD-10-CM

## 2024-05-23 DIAGNOSIS — Z7689 Persons encountering health services in other specified circumstances: Secondary | ICD-10-CM

## 2024-05-23 MED ORDER — METOPROLOL TARTRATE 25 MG PO TABS: ORAL_TABLET | ORAL | 3 refills | Status: DC

## 2024-05-23 NOTE — Progress Notes (Signed)
 New patient visit  Patient: Whitney Rivera   DOB: 1961-02-15   63 y.o. Female  MRN: [id number] Visit Date: 05/23/2024  Today's healthcare provider: Jolynn Spencer, PA-C   Chief Complaint  Patient presents with   Establish Care    Last pcp: dr jeannie  Last mammo: none in epic  Last colonoscopy: 2016-2017    Subjective    Whitney Rivera is a 63 y.o. female who presents today as a new patient to establish care.    Discussed the use of AI scribe software for clinical note transcription with the patient, who gave verbal consent to proceed.  History of Present Illness [name] is a 63 year old female with hypertension who presents for management of her blood pressure medications.  She takes metoprolol  25 mg twice daily, with her last refill in January 2025, and has about ten days of medication left. She discontinued lisinopril  and is not currently taking aspirin. She has experienced significant weight loss from 134 pounds in November 2024 to 104 pounds, now at 108 pounds. She attributes some symptoms to living in a house with contaminated water containing manganese, which she believes may have contributed to muscle atrophy and skin changes. Her muscles appear atrophied, and her skin is loose.  She has a history of smoking for over 25 years, quitting in 2017 after back surgery, and denies current smoking. She acknowledges not eating well and is concerned about her vitamin levels. She consumes meat despite not being fond of it.  She experiences muscle spasms and cramps in her lower legs but denies fatigue beyond her usual level, tingling, or numbness. She has a little post-nasal drainage and a morning cough. She has been told she might have COPD, but it is not documented in her chart.  She has chronic pain syndrome and takes pain medication as needed, typically half a dose two times a week or less, and is concerned about constipation from the medication.    Past Medical History:  Diagnosis  Date   CHF (congestive heart failure) (HCC)    Chronic back pain    Chronic constipation    Chronic fatigue 10/28/2017   Hypertension    Lumbar post-laminectomy syndrome 05/10/2019   Memory difficulty 10/28/2017   Osteoporosis    Scoliosis    Past Surgical History:  Procedure Laterality Date   BACK SURGERY     TUBAL LIGATION     Family Status  Relation Name Status   Mother  Deceased   Father  Deceased   Brother  (Not Specified)  No partnership data on file   Family History  Problem Relation Age of Onset   Heart disease Mother    Cancer Mother    Cancer Father    Hypertension Brother    Diabetes Brother    Social History   Socioeconomic History   Marital status: Divorced    Spouse name: Not on file   Number of children: Not on file   Years of education: Not on file   Highest education level: Not on file  Occupational History   Not on file  Tobacco Use   Smoking status: Former   Smokeless tobacco: Never  Vaping Use   Vaping status: Never Used  Substance and Sexual Activity   Alcohol use: No   Drug use: No   Sexual activity: Not on file  Other Topics Concern   Not on file  Social History Narrative   Not on file   Social Drivers of Health  Financial Resource Strain: Low Risk  (05/23/2024)   Overall Financial Resource Strain (CARDIA)    Difficulty of Paying Living Expenses: Not very hard  Food Insecurity: No Food Insecurity (05/23/2024)   Hunger Vital Sign    Worried About Running Out of Food in the Last Year: Never true    Ran Out of Food in the Last Year: Never true  Transportation Needs: No Transportation Needs (05/23/2024)   PRAPARE - Administrator, Civil Service (Medical): No    Lack of Transportation (Non-Medical): No  Physical Activity: Sufficiently Active (05/23/2024)   Exercise Vital Sign    Days of Exercise per Week: 5 days    Minutes of Exercise per Session: 30 min  Stress: No Stress Concern Present (05/23/2024)   [employer] of  Occupational Health - Occupational Stress Questionnaire    Feeling of Stress: Not at all  Social Connections: Socially Isolated (05/09/2021)   Social Connection and Isolation Panel    Frequency of Communication with Friends and Family: Three times a week    Frequency of Social Gatherings with Friends and Family: Three times a week    Attends Religious Services: Never    Active Member of Clubs or Organizations: No    Attends Banker Meetings: Never    Marital Status: Divorced   Outpatient Medications Prior to Visit  Medication Sig   metoprolol  tartrate (LOPRESSOR ) 25 MG tablet TAKE 1 TABLET BY MOUTH TWICE A DAY   oxaprozin (DAYPRO) 600 MG tablet TAKE 1 TABLET BY MOUTH ONCE A DAY   Oxycodone  HCl 10 MG TABS Take 0.5 tablets (5 mg total) by mouth every 6 (six) hours.   [START ON 06/03/2024] Oxycodone  HCl 10 MG TABS Take 0.5 tablets (5 mg total) by mouth in the morning, at noon, in the evening, and at bedtime.   [START ON 07/03/2024] Oxycodone  HCl 10 MG TABS Take 0.5 tablets (5 mg total) by mouth in the morning, at noon, in the evening, and at bedtime.   [DISCONTINUED] aspirin EC 81 MG tablet Take 81 mg by mouth daily.   [DISCONTINUED] dicyclomine  (BENTYL ) 10 MG capsule Take 1 capsule (10 mg total) by mouth 4 (four) times daily -  before meals and at bedtime. (Patient not taking: Reported on 05/19/2024)   [DISCONTINUED] doxycycline  (VIBRA -TABS) 100 MG tablet Take 1 tablet (100 mg total) by mouth 2 (two) times daily. (Patient not taking: Reported on 05/19/2024)   [DISCONTINUED] lisinopril  (ZESTRIL ) 2.5 MG tablet TAKE 1 TABLET BY MOUTH ONCE A DAY (Patient not taking: Reported on 05/19/2024)   [DISCONTINUED] metoprolol  tartrate (LOPRESSOR ) 25 MG tablet Take 1 tablet by mouth 2 (two) times daily.   [DISCONTINUED] Multiple Vitamins-Minerals (MULTIVITAMIN WITH MINERALS) tablet Take 1 tablet by mouth daily. (Patient not taking: Reported on 05/19/2024)   No facility-administered medications prior  to visit.   Allergies  Allergen Reactions   Ceclor [Cefaclor] Shortness Of Breath, Swelling and Rash   Ciprofloxacin Hives and Itching    Immunization History  Administered Date(s) Administered   Moderna Sars-Covid-2 Vaccination 10/23/2019, 08/26/2020    Health Maintenance  Topic Date Due   Hepatitis C Screening  Never done   DTaP/Tdap/Td (1 - Tdap) Never done   Mammogram  Never done   Pneumococcal Vaccine: 50+ Years (1 of 1 - PCV) Never done   Zoster Vaccines- Shingrix (1 of 2) Never done   Cervical Cancer Screening (HPV/Pap Cotest)  06/27/2019   Medicare Annual Wellness (AWV)  05/09/2022   Influenza Vaccine  Never done   DEXA SCAN  Never done   COVID-19 Vaccine (3 - 2025-26 season) 04/24/2024   Colonoscopy  08/24/2024   HIV Screening  Completed   Hepatitis B Vaccines 19-59 Average Risk  Aged Out   HPV VACCINES  Aged Out   Meningococcal B Vaccine  Aged Out    Patient Care Team: Leandra Vanderweele, PA-C as PCP - General (Physician Assistant) Rainwater, Ellender POUR, PA-C as Physician Assistant (Physician Assistant)  Review of Systems  All other systems reviewed and are negative.  Except see HPI       Objective    BP 99/88   Pulse 74   Resp 14   Ht 5' 4 (1.626 m)   Wt 108 lb 3.2 oz (49.1 kg)   SpO2 100%   BMI 18.57 kg/m     Physical Exam Vitals reviewed.  Constitutional:      General: She is not in acute distress.    Appearance: Normal appearance. She is well-developed. She is not diaphoretic.  HENT:     Head: Normocephalic and atraumatic.  Eyes:     General: No scleral icterus.    Conjunctiva/sclera: Conjunctivae normal.  Neck:     Thyroid : No thyromegaly.  Cardiovascular:     Rate and Rhythm: Normal rate and regular rhythm.     Pulses: Normal pulses.          Dorsalis pedis pulses are 2+ on the right side and 2+ on the left side.     Heart sounds: Normal heart sounds. No murmur heard. Pulmonary:     Effort: Pulmonary effort is normal. No  respiratory distress.     Breath sounds: Normal breath sounds. No wheezing, rhonchi or rales.  Musculoskeletal:     Cervical back: Neck supple.     Right lower leg: No edema.     Left lower leg: No edema.     Right foot: Normal range of motion. No deformity, bunion, Charcot foot or foot drop.     Left foot: Normal range of motion. No deformity, bunion, Charcot foot or foot drop.  Feet:     Right foot:     Protective Sensation: 8 sites tested.  8 sites sensed.     Skin integrity: Skin integrity normal. No ulcer, blister, skin breakdown, erythema, warmth, callus, dry skin or fissure.     Toenail Condition: Right toenails are normal.     Left foot:     Protective Sensation: 8 sites tested.  8 sites sensed.     Skin integrity: Skin integrity normal. No ulcer, blister, skin breakdown, erythema, warmth, callus, dry skin or fissure.     Toenail Condition: Left toenails are normal.  Lymphadenopathy:     Cervical: No cervical adenopathy.  Skin:    General: Skin is warm and dry.     Findings: No rash.  Neurological:     Mental Status: She is alert and oriented to person, place, and time. Mental status is at baseline.  Psychiatric:        Mood and Affect: Mood normal.        Behavior: Behavior normal.     Depression Screen    05/23/2024    9:38 AM 03/27/2024    3:43 PM 03/15/2023    2:26 PM 11/24/2021    1:40 PM  PHQ 2/9 Scores  PHQ - 2 Score 0 0 0 0   No results found for any visits on 05/23/24.  Assessment & Plan  Assessment & Plan Hypertension Chronic and stable Hypertension managed with metoprolol . Blood pressure well-controlled. - Prescribe metoprolol  25 mg twice daily. Cmp ordered Continue low salt diet and regular exercise Will follow-up  Chronic pain syndrome with low back pain Chronic pain managed with infrequent use of pain medication due to gastrointestinal side effects. Per chart review pt established care with pain clinic On oxycodone  10 mg Will  follow-up  Muscle cramps and atrophy Muscle cramps and atrophy in lower extremities. Considered nutritional deficiencies or dehydration. Noted weight loss and muscle atrophy after manganese exposure. - Order blood tests to check vitamin levels, including vitamin D and B12. - Evaluate for anemia, kidney function, and liver function. - Encourage adequate hydration.  Chronic fatigue Chronic fatigue possibly related to nutritional deficiencies or other conditions. Lifestyle and diet may contribute. - Order blood tests to evaluate thyroid  function, cholesterol, and glucose levels. - Assess for anemia and vitamin deficiencies.  Allergic Rhinitis: - Use nasal saline rinses before nose sprays such as with Neilmed Sinus Rinse bottle.  Use distilled water.   - Use Flonase 2 sprays each nostril daily. Aim upward and outward. Postnasal drip possibly exacerbated by dry climate.  General Health Maintenance Declined routine screenings and vaccinations. Prioritizes finding new housing. - Offer wellness exam to discuss prevention and update health information. - Perform blood work for hepatitis C screening.  Encounter to establish care Welcomed to our clinic Reviewed past medical hx, social hx, family hx and surgical hx Pt advised to send all vaccination records or screening  Pure hypercholesterolemia Chronic and previously  unstable Continue with low cholesterol diet and regular exercise Not on medication - metoprolol  tartrate (LOPRESSOR ) 25 MG tablet; TAKE 1 TABLET BY MOUTH TWICE A DAY  Dispense: 60 tablet; Refill: 3 Lp ordered Will follow-up  Primary hypertension (Primary)  - metoprolol  tartrate (LOPRESSOR ) 25 MG tablet; TAKE 1 TABLET BY MOUTH TWICE A DAY  Dispense: 60 tablet; Refill: 3  Other fatigue  - CBC with Differential/Platelet - Comprehensive metabolic panel with GFR - Hemoglobin A1c - Lipid panel - TSH - VITAMIN D 25 Hydroxy (Vit-D Deficiency, Fractures) - Vitamin  B12  History of smoking 25-50 pack years   Need for hepatitis C screening test Low risk screening - Hepatitis C antibody  Muscle spasms of both lower extremities Abnormal CK - CK (Creatine Kinase)  Chronic pain syndrome  No follow-ups on file.    The patient was advised to call back or seek an in-person evaluation if the symptoms worsen or if the condition fails to improve as anticipated.  I discussed the assessment and treatment plan with the patient. The patient was provided an opportunity to ask questions and all were answered. The patient agreed with the plan and demonstrated an understanding of the instructions.  I, Jerone Cudmore, PA-C have reviewed all documentation for this visit. The documentation on  05/23/2024   for the exam, diagnosis, procedures, and orders are all accurate and complete.  Jolynn Spencer, [facility], MMS [facility] [phone] (phone) [phone] (fax)  [facility]

## 2024-06-12 ENCOUNTER — Other Ambulatory Visit: Payer: Self-pay | Admitting: Anesthesiology

## 2024-06-21 ENCOUNTER — Ambulatory Visit: Admitting: Physician Assistant

## 2024-06-23 ENCOUNTER — Ambulatory Visit: Admitting: Physician Assistant

## 2024-06-23 ENCOUNTER — Encounter: Payer: Self-pay | Admitting: Physician Assistant

## 2024-06-23 VITALS — BP 138/72 | HR 67 | Resp 14 | Ht 64.0 in | Wt 106.5 lb

## 2024-06-23 DIAGNOSIS — R5383 Other fatigue: Secondary | ICD-10-CM | POA: Diagnosis not present

## 2024-06-23 DIAGNOSIS — R062 Wheezing: Secondary | ICD-10-CM | POA: Diagnosis not present

## 2024-06-23 DIAGNOSIS — G894 Chronic pain syndrome: Secondary | ICD-10-CM

## 2024-06-23 DIAGNOSIS — I10 Essential (primary) hypertension: Secondary | ICD-10-CM | POA: Diagnosis not present

## 2024-06-23 MED ORDER — METOPROLOL TARTRATE 25 MG PO TABS
ORAL_TABLET | ORAL | 1 refills | Status: DC
Start: 2024-06-23 — End: 2024-06-23

## 2024-06-23 MED ORDER — METOPROLOL TARTRATE 25 MG PO TABS: ORAL_TABLET | ORAL | 1 refills | Status: AC

## 2024-06-23 MED ORDER — METOPROLOL TARTRATE 25 MG PO TABS: ORAL_TABLET | ORAL | 1 refills | Status: DC

## 2024-06-23 NOTE — Progress Notes (Signed)
 Established patient visit  Patient: Whitney Rivera   DOB: [birth year]   63 y.o. Female  MRN: [id number] Visit Date: 06/23/2024  Today's healthcare provider: Jolynn Spencer, PA-C   Chief Complaint  Patient presents with   Medical Management of Chronic Issues    Pt is wanting rx for HTN to be printed off instead. Pt lives in her [place] and always moving around to warmer weather. Would prefer paper rx to have on hand.    Subjective     HPI     Medical Management of Chronic Issues    Additional comments: Pt is wanting rx for HTN to be printed off instead. Pt lives in her [place] and always moving around to warmer weather. Would prefer paper rx to have on hand.       Last edited by Wilfred Hargis RAMAN, CMA on 06/23/2024 10:04 AM.       Discussed the use of AI scribe software for clinical note transcription with the patient, who gave verbal consent to proceed.  History of Present Illness Whitney Rivera is a 63 year old female who presents with wheezing and respiratory concerns.  She experiences wheezing and occasional coughing without shortness of breath, chest pain, or palpitations. She has no history of asthma and does not smoke or vape. She used THC gummies for pain management but discontinued them two months ago. She is under pain management care and takes prescribed pain medication as needed, often breaking pills in half and sometimes going a week and a half without them. Her housing situation is challenging due to a delayed [employer], and she is living on minimal resources, eating one meal a day and weighing 106 pounds.       05/23/2024    9:38 AM 03/27/2024    3:43 PM 03/15/2023    2:26 PM  Depression screen PHQ 2/9  Decreased Interest 0 0   Down, Depressed, Hopeless 0 0 0  PHQ - 2 Score 0 0 0       No data to display          Medications: Outpatient Medications Prior to Visit  Medication Sig   oxaprozin (DAYPRO) 600 MG tablet TAKE 1 TABLET BY MOUTH ONCE A DAY   Oxycodone  HCl 10  MG TABS Take 0.5 tablets (5 mg total) by mouth every 6 (six) hours.   Oxycodone  HCl 10 MG TABS Take 0.5 tablets (5 mg total) by mouth in the morning, at noon, in the evening, and at bedtime.   [START ON 07/03/2024] Oxycodone  HCl 10 MG TABS Take 0.5 tablets (5 mg total) by mouth in the morning, at noon, in the evening, and at bedtime.   [DISCONTINUED] metoprolol  tartrate (LOPRESSOR ) 25 MG tablet TAKE 1 TABLET BY MOUTH TWICE A DAY   No facility-administered medications prior to visit.    Review of Systems All negative Except see HPI       Objective    BP 138/72   Pulse 67   Resp 14   Ht 5' 4 (1.626 m)   Wt 106 lb 8 oz (48.3 kg)   SpO2 100%   BMI 18.28 kg/m     Physical Exam Vitals reviewed.  Constitutional:      General: She is not in acute distress.    Appearance: Normal appearance. She is well-developed. She is not diaphoretic.  HENT:     Head: Normocephalic and atraumatic.  Eyes:     General: No scleral icterus.    Conjunctiva/sclera: Conjunctivae normal.  Neck:     Thyroid : No thyromegaly.  Cardiovascular:     Rate and Rhythm: Normal rate and regular rhythm.     Pulses: Normal pulses.     Heart sounds: Normal heart sounds. No murmur heard. Pulmonary:     Effort: Pulmonary effort is normal. No respiratory distress.     Breath sounds: Normal breath sounds. No wheezing, rhonchi or rales.  Musculoskeletal:     Cervical back: Neck supple.     Right lower leg: No edema.     Left lower leg: No edema.  Lymphadenopathy:     Cervical: No cervical adenopathy.  Skin:    General: Skin is warm and dry.     Findings: No rash.  Neurological:     Mental Status: She is alert and oriented to person, place, and time. Mental status is at baseline.  Psychiatric:        Mood and Affect: Mood normal.        Behavior: Behavior normal.      No results found for any visits on 06/23/24.      Assessment & Plan Wheezing Intermittent wheezing without dyspnea, chest pain, or  tachycardia. No asthma. Differential includes asthma, COPD, or other respiratory conditions. - Advised monitoring symptoms and seeking urgent care if symptoms worsen or dyspnea develops. - Plan ultrasound of lymph nodes upon return to assess for abnormalities.  Primary hypertension Chronic and stable Continue metoprolol  Labwork pending Continue low salt diet and regular exercise - metoprolol  tartrate (LOPRESSOR ) 25 MG tablet; TAKE 1 TABLET BY MOUTH TWICE A DAY  Dispense: 360 tablet; Refill: 1 Will follow-up  Chronic fatigue Most likely related to nutritional deficiencies or other conditions Advised lifestyle modifications Workup pending  Chronic pain syndrome Currently on oxycodone  Managed by pain clinic dr. Myra  No orders of the defined types were placed in this encounter.   No follow-ups on file.   The patient was advised to call back or seek an in-person evaluation if the symptoms worsen or if the condition fails to improve as anticipated.  I discussed the assessment and treatment plan with the patient. The patient was provided an opportunity to ask questions and all were answered. The patient agreed with the plan and demonstrated an understanding of the instructions.  I, Daisi Kentner, PA-C have reviewed all documentation for this visit. The documentation on 06/23/2024  for the exam, diagnosis, procedures, and orders are all accurate and complete.  Jolynn Spencer, [facility], MMS [facility] [place] [phone] (phone) [phone] (fax)  [facility]

## 2024-06-28 LAB — TOXASSURE SELECT 13 (MW), URINE

## 2024-07-31 ENCOUNTER — Ambulatory Visit

## 2024-07-31 NOTE — Progress Notes (Unsigned)
 I connected with  Whitney Rivera on 07/31/24 by a {Video Enabled:26378::video and audio} enabled telemedicine application and verified that I am speaking with the correct person using two identifiers.  {Patient Location:[phone]::Home}  {Provider [phone]::Home Office}  Persons Participating in Visit: {Persons Participating in Visit:32444}  I discussed the limitations of evaluation and management by telemedicine. The patient expressed understanding and agreed to proceed.  Vital Signs: Because this visit was a virtual/telehealth visit, some criteria may be missing or patient reported. Any vitals not documented were not able to be obtained and vitals that have been documented are patient reported.

## 2024-08-01 ENCOUNTER — Other Ambulatory Visit: Payer: Self-pay

## 2024-08-01 DIAGNOSIS — Z1231 Encounter for screening mammogram for malignant neoplasm of breast: Secondary | ICD-10-CM

## 2024-10-24 ENCOUNTER — Ambulatory Visit
